# Patient Record
Sex: Female | Born: 1990 | Race: White | Hispanic: No | Marital: Married | Smoking: Never smoker
Health system: Southern US, Community
[De-identification: ages and names within clinical notes are randomized; demographics above are authoritative.]

## PROBLEM LIST (undated history)

## (undated) DIAGNOSIS — Z789 Other specified health status: Secondary | ICD-10-CM

## (undated) HISTORY — PX: NO PAST SURGERIES: SHX2092

## (undated) HISTORY — PX: HEMORRHOID SURGERY: SHX153

---

## 2018-08-27 HISTORY — PX: WISDOM TOOTH EXTRACTION: SHX21

## 2018-08-27 NOTE — L&D Delivery Note (Signed)
Pt was admitted in labor. She progressed along a nl labor curve. She had SROM. She pushed for 5 min and had a SVD of one live viable female infant in the ROA position over an intact perineum. Nuchal cord x 1. Placenta-S/I. EBL-400cc Baby to NBN

## 2018-09-21 ENCOUNTER — Emergency Department (HOSPITAL_COMMUNITY)
Admission: EM | Admit: 2018-09-21 | Discharge: 2018-09-21 | Disposition: A | Discharge status: Home / Self Care | Payer: Medicaid Other | Attending: Emergency Medicine | Admitting: Emergency Medicine

## 2018-09-21 ENCOUNTER — Encounter (HOSPITAL_COMMUNITY): Payer: Self-pay | Admitting: *Deleted

## 2018-09-21 DIAGNOSIS — Z5321 Procedure and treatment not carried out due to patient leaving prior to being seen by health care provider: Secondary | ICD-10-CM | POA: Insufficient documentation

## 2018-09-21 DIAGNOSIS — K0889 Other specified disorders of teeth and supporting structures: Secondary | ICD-10-CM | POA: Diagnosis not present

## 2018-09-21 NOTE — ED Triage Notes (Signed)
-  Via [place] Interpreter, pt has had dental pain x 2 week, procedure done for the same and has a 78.65 month old baby at home . She confirms she is taking tylenol 250/asa 250mg , tylenol 500mg , and ibuprofen 400mg  every 6 hours. Husband brought bottles and confirmed these are the doses and time frame she has been taking the medication, he is concerned this amount of medication is too much medication. She is having a headache and buzzing in her right ear.

## 2018-09-21 NOTE — ED Notes (Signed)
No answer in waiting room 

## 2018-09-22 ENCOUNTER — Emergency Department (HOSPITAL_COMMUNITY)
Admission: EM | Admit: 2018-09-22 | Discharge: 2018-09-23 | Disposition: A | Discharge status: Home / Self Care | Payer: Medicaid Other | Attending: Emergency Medicine | Admitting: Emergency Medicine

## 2018-09-22 ENCOUNTER — Encounter (HOSPITAL_COMMUNITY): Payer: Self-pay

## 2018-09-22 DIAGNOSIS — G43909 Migraine, unspecified, not intractable, without status migrainosus: Secondary | ICD-10-CM | POA: Diagnosis not present

## 2018-09-22 DIAGNOSIS — O9089 Other complications of the puerperium, not elsewhere classified: Secondary | ICD-10-CM

## 2018-09-22 DIAGNOSIS — L650 Telogen effluvium: Secondary | ICD-10-CM

## 2018-09-22 DIAGNOSIS — R51 Headache: Secondary | ICD-10-CM | POA: Diagnosis present

## 2018-09-22 DIAGNOSIS — L659 Nonscarring hair loss, unspecified: Secondary | ICD-10-CM | POA: Insufficient documentation

## 2018-09-22 NOTE — ED Triage Notes (Signed)
Pt having continued headaches after getting wisdom teeth pulled last week. Pt LWBS yesterday after waiting several hours. Pt taking tylenol and ibuprofen without relief. P also having dizziness but no blurred vision.

## 2018-09-23 ENCOUNTER — Emergency Department (HOSPITAL_COMMUNITY): Payer: Medicaid Other

## 2018-09-23 MED ORDER — METOCLOPRAMIDE HCL 5 MG/ML IJ SOLN
10.0000 mg | INTRAMUSCULAR | Status: AC
  Administered 2018-09-23: 10 mg via INTRAVENOUS
  Filled 2018-09-23: qty 2

## 2018-09-23 MED ORDER — KETOROLAC TROMETHAMINE 30 MG/ML IJ SOLN: 30.0000 mg | Freq: Once | INTRAMUSCULAR | Status: DC

## 2018-09-23 MED ORDER — BUTALBITAL-APAP-CAFFEINE 50-325-40 MG PO TABS: 1.0000 | ORAL_TABLET | Freq: Three times a day (TID) | ORAL | 0 refills | Status: DC | PRN

## 2018-09-23 MED ORDER — DIPHENHYDRAMINE HCL 50 MG/ML IJ SOLN
12.5000 mg | Freq: Once | INTRAMUSCULAR | Status: AC
  Administered 2018-09-23: 12.5 mg via INTRAVENOUS
  Filled 2018-09-23: qty 1

## 2018-09-23 NOTE — ED Provider Notes (Signed)
[facility] Provider Note   CSN: [id number] Arrival date & time: 09/22/18  1641     History   Chief Complaint Chief Complaint  Patient presents with  . Headache    HPI Jill Conley is a 28 y.o. female.  28 year old female with no significant past medical history presents to the emergency department for evaluation of a headache.  She has had a constant right sided parietal headache with intermittent sharp, lightninglike sensations to the top of her head x1.5 weeks.  She had her right lower wisdom tooth the left upper wisdom tooth extracted shortly prior to onset of her headache.  She was discharged with Percocet which she used for pain control x2 days.  Has otherwise been using Tylenol and ibuprofen without relief.  Husband states that the patient's body will "jerk" when she experiences the sharp pain sensation to the top of her head.  She has been getting poor sleep as a result of her ongoing discomfort.  Symptoms further associated with dizziness and photophobia.  She has not had any vomiting, extremity numbness or paresthesias, extremity weakness, fevers, persistent facial swelling, dentalgia.  She also notes recent hair loss, but she is 3.5 months postpartum.  No hx of prior migraines.  The history is provided by the patient and the spouse. No language interpreter was used.  Headache    History reviewed. No pertinent past medical history.  There are no active problems to display for this patient.   Past Surgical History:  Procedure Laterality Date  . WISDOM TOOTH EXTRACTION  2020     OB History   No obstetric history on file.      Home Medications    Prior to Admission medications   Medication Sig Start Date End Date Taking? Authorizing Provider  butalbital-acetaminophen-caffeine (FIORICET, ESGIC) [phone] MG tablet Take 1-2 tablets by mouth every 8 (eight) hours as needed for headache. 09/23/18 09/23/19  Antony MaduraHumes, Becky Berberian, PA-C     Family History No family history on file.  Social History Social History   Tobacco Use  . Smoking status: Never Smoker  Substance Use Topics  . Alcohol use: Never    Frequency: Never  . Drug use: Never     Allergies   Patient has no known allergies.   Review of Systems Review of Systems  Neurological: Positive for headaches.  Ten systems reviewed and are negative for acute change, except as noted in the HPI.    Physical Exam Updated Vital Signs BP 121/80   Pulse 69   Temp 98.7 F (37.1 C) (Oral)   Resp 14   SpO2 100%   Physical Exam Vitals signs and nursing note reviewed.  Constitutional:      General: She is not in acute distress.    Appearance: She is well-developed. She is not diaphoretic.     Comments: Nontoxic appearing and in NAD  HENT:     Head: Normocephalic and atraumatic.     Comments: No facial swelling    Right Ear: Ear canal and external ear normal.     Left Ear: Ear canal and external ear normal.     Ears:     Comments: Cerumen obscuring much of right TM; visualized portion is normal.    Mouth/Throat:     Mouth: Mucous membranes are moist.     Comments: No malocclusion, trismus, gingival fluctuance or purulence. Site of prior wisdom tooth extraction appears to be healing well. Symmetric rise of the uvula with phonation.  Eyes:     General: No scleral icterus.    Conjunctiva/sclera: Conjunctivae normal.     Pupils: Pupils are equal, round, and reactive to light.     Comments: Normal EOMs.  Neck:     Musculoskeletal: Normal range of motion.     Comments: No meningismus Pulmonary:     Effort: Pulmonary effort is normal. No respiratory distress.     Comments: Respirations even and unlabored Musculoskeletal: Normal range of motion.  Skin:    General: Skin is warm and dry.     Coloration: Skin is not pale.     Findings: No erythema or rash.  Neurological:     General: No focal deficit present.     Mental Status: She is alert and  oriented to person, place, and time.     Coordination: Coordination normal.     Comments: GCS 15. Speech is goal oriented. No cranial nerve deficits appreciated; symmetric eyebrow raise, no facial drooping, tongue midline. Patient has equal grip strength bilaterally with 5/5 strength against resistance in all major muscle groups bilaterally. Sensation to light touch intact. Patient moves extremities without ataxia.   Psychiatric:        Behavior: Behavior normal.      ED Treatments / Results  Labs (all labs ordered are listed, but only abnormal results are displayed) Labs Reviewed - No data to display  EKG None  Radiology Ct Head Wo Contrast  Result Date: 09/23/2018 CLINICAL DATA:  Headache EXAM: CT HEAD WITHOUT CONTRAST TECHNIQUE: Contiguous axial images were obtained from the base of the skull through the vertex without intravenous contrast. COMPARISON:  None. FINDINGS: Brain: There is no mass, hemorrhage or extra-axial collection. The size and configuration of the ventricles and extra-axial CSF spaces are normal. The brain parenchyma is normal, without acute or chronic infarction. Vascular: No abnormal hyperdensity of the major intracranial arteries or dural venous sinuses. No intracranial atherosclerosis. Skull: The visualized skull base, calvarium and extracranial soft tissues are normal. Sinuses/Orbits: No fluid levels or advanced mucosal thickening of the visualized paranasal sinuses. No mastoid or middle ear effusion. The orbits are normal. IMPRESSION: Normal head CT. Electronically Signed   By: Deatra RobinsonKevin  Herman M.D.   On: 09/23/2018 04:46    Procedures Procedures (including critical care time)  Medications Ordered in ED Medications  diphenhydrAMINE (BENADRYL) injection 12.5 mg (12.5 mg Intravenous Given 09/23/18 0446)  metoCLOPramide (REGLAN) injection 10 mg (10 mg Intravenous Given 09/23/18 0446)    5:33 AM Patient reassessed.  Noted to be sleeping.  Upon waking, patient states  that her headache has subsided.  Will cancel additional Toradol.  Plan to discharge with Fioricet.  The patient is not breast-feeding.   Initial Impression / Assessment and Plan / ED Course  I have reviewed the triage vital signs and the nursing notes.  Pertinent labs & imaging results that were available during my care of the patient were reviewed by me and considered in my medical decision making (see chart for details).     Patient presents to the emergency department for evaluation of headache which began 1.5 weeks ago.  Patient with no history of recent head injury or trauma.  Did have a wisdom tooth extraction prior to onset of pain.  No fever, nuchal rigidity, meningismus to suggest meningitis.  Neurologic exam today is nonfocal.  Her head CT today is reassuring without signs of mass, hemorrhage, hydrocephalus.  On reassessment, the patient has had significant improvement in headache symptoms following a migraine cocktail.  I do not believe further emergent workup is indicated at this time.  Return precautions discussed and provided.  Patient discharged in stable condition with no unaddressed concerns.   Vitals:   09/22/18 1716 09/22/18 2123 09/23/18 0220 09/23/18 0545  BP: 118/67 119/74 121/80 105/66  Pulse: (!) 58 74 69 72  Resp: 14 14 14 16   Temp: 98.7 F (37.1 C)     TempSrc: Oral     SpO2: 98% 100% 100% 97%    Final Clinical Impressions(s) / ED Diagnoses   Final diagnoses:  Migraine without status migrainosus, not intractable, unspecified migraine type  Postpartum alopecia    ED Discharge Orders         Ordered    butalbital-acetaminophen-caffeine (FIORICET, ESGIC) 50-325-40 MG tablet  Every 8 hours PRN     09/23/18 0535           Antony Madura, PA-C 09/23/18 0602    Ward, Layla Maw, DO 09/23/18 [phone]

## 2018-09-23 NOTE — Discharge Instructions (Signed)
We recommend use of Fioricet as needed for persistent headache.  Follow-up with a primary care doctor to ensure that symptoms resolve.  If you experience ongoing headaches, you may benefit from follow-up with neurology.  Call to make an appointment with a neurologist if necessary.

## 2019-03-27 DIAGNOSIS — Z23 Encounter for immunization: Secondary | ICD-10-CM | POA: Diagnosis not present

## 2019-03-27 DIAGNOSIS — Z3480 Encounter for supervision of other normal pregnancy, unspecified trimester: Secondary | ICD-10-CM | POA: Diagnosis not present

## 2019-04-24 DIAGNOSIS — O26843 Uterine size-date discrepancy, third trimester: Secondary | ICD-10-CM | POA: Diagnosis not present

## 2019-05-15 ENCOUNTER — Inpatient Hospital Stay (HOSPITAL_COMMUNITY): Payer: Medicaid Other | Admitting: Anesthesiology

## 2019-05-15 ENCOUNTER — Inpatient Hospital Stay (HOSPITAL_COMMUNITY)
Admission: EM | Admit: 2019-05-15 | Discharge: 2019-05-17 | DRG: 807 | Disposition: A | Discharge status: Home / Self Care | Payer: Medicaid Other | Attending: Obstetrics and Gynecology | Admitting: Obstetrics and Gynecology

## 2019-05-15 ENCOUNTER — Other Ambulatory Visit: Payer: Self-pay

## 2019-05-15 ENCOUNTER — Encounter (HOSPITAL_COMMUNITY): Payer: Self-pay | Admitting: *Deleted

## 2019-05-15 DIAGNOSIS — Z3490 Encounter for supervision of normal pregnancy, unspecified, unspecified trimester: Secondary | ICD-10-CM

## 2019-05-15 DIAGNOSIS — Z20828 Contact with and (suspected) exposure to other viral communicable diseases: Secondary | ICD-10-CM | POA: Diagnosis not present

## 2019-05-15 DIAGNOSIS — Z3A35 35 weeks gestation of pregnancy: Secondary | ICD-10-CM | POA: Diagnosis not present

## 2019-05-15 DIAGNOSIS — O99824 Streptococcus B carrier state complicating childbirth: Secondary | ICD-10-CM | POA: Diagnosis present

## 2019-05-15 DIAGNOSIS — O479 False labor, unspecified: Secondary | ICD-10-CM | POA: Diagnosis present

## 2019-05-15 DIAGNOSIS — O4700 False labor before 37 completed weeks of gestation, unspecified trimester: Secondary | ICD-10-CM | POA: Diagnosis present

## 2019-05-15 HISTORY — DX: Other specified health status: Z78.9

## 2019-05-15 LAB — SARS CORONAVIRUS 2 BY RT PCR (HOSPITAL ORDER, PERFORMED IN ~~LOC~~ HOSPITAL LAB): SARS Coronavirus 2: NEGATIVE

## 2019-05-15 LAB — CBC
HCT: 38.5 % (ref 36.0–46.0)
Hemoglobin: 13.1 g/dL (ref 12.0–15.0)
MCH: 31.5 pg (ref 26.0–34.0)
MCHC: 34 g/dL (ref 30.0–36.0)
MCV: 92.5 fL (ref 80.0–100.0)
Platelets: 278 10*3/uL (ref 150–400)
RBC: 4.16 MIL/uL (ref 3.87–5.11)
RDW: 12.5 % (ref 11.5–15.5)
WBC: 13.1 10*3/uL — ABNORMAL HIGH (ref 4.0–10.5)
nRBC: 0 % (ref 0.0–0.2)

## 2019-05-15 LAB — URINALYSIS, ROUTINE W REFLEX MICROSCOPIC
Bilirubin Urine: NEGATIVE
Glucose, UA: NEGATIVE mg/dL
Hgb urine dipstick: NEGATIVE
Ketones, ur: NEGATIVE mg/dL
Nitrite: NEGATIVE
Protein, ur: NEGATIVE mg/dL
Specific Gravity, Urine: 1.008 (ref 1.005–1.030)
pH: 6 (ref 5.0–8.0)

## 2019-05-15 LAB — TYPE AND SCREEN
ABO/RH(D): A POS
Antibody Screen: NEGATIVE

## 2019-05-15 LAB — GROUP B STREP BY PCR: Group B strep by PCR: POSITIVE — AB

## 2019-05-15 MED ORDER — DIBUCAINE (PERIANAL) 1 % EX OINT: 1.0000 "application " | TOPICAL_OINTMENT | CUTANEOUS | Status: DC | PRN

## 2019-05-15 MED ORDER — ACETAMINOPHEN 325 MG PO TABS
650.0000 mg | ORAL_TABLET | ORAL | Status: DC | PRN
  Administered 2019-05-15 – 2019-05-17 (×5): 650 mg via ORAL
  Filled 2019-05-15 (×5): qty 2

## 2019-05-15 MED ORDER — SENNOSIDES-DOCUSATE SODIUM 8.6-50 MG PO TABS
2.0000 | ORAL_TABLET | ORAL | Status: DC
  Administered 2019-05-16 – 2019-05-17 (×2): 2 via ORAL
  Filled 2019-05-15 (×2): qty 2

## 2019-05-15 MED ORDER — ONDANSETRON HCL 4 MG PO TABS: 4.0000 mg | ORAL_TABLET | ORAL | Status: DC | PRN

## 2019-05-15 MED ORDER — MEASLES, MUMPS & RUBELLA VAC IJ SOLR: 0.5000 mL | Freq: Once | INTRAMUSCULAR | Status: DC

## 2019-05-15 MED ORDER — BETAMETHASONE SOD PHOS & ACET 6 (3-3) MG/ML IJ SUSP
12.0000 mg | Freq: Once | INTRAMUSCULAR | Status: DC
  Filled 2019-05-15: qty 2

## 2019-05-15 MED ORDER — PHENYLEPHRINE 40 MCG/ML (10ML) SYRINGE FOR IV PUSH (FOR BLOOD PRESSURE SUPPORT)
80.0000 ug | PREFILLED_SYRINGE | INTRAVENOUS | Status: AC | PRN
  Administered 2019-05-15 (×3): 80 ug via INTRAVENOUS

## 2019-05-15 MED ORDER — ACETAMINOPHEN 325 MG PO TABS: 650.0000 mg | ORAL_TABLET | ORAL | Status: DC | PRN

## 2019-05-15 MED ORDER — ONDANSETRON HCL 4 MG/2ML IJ SOLN: 4.0000 mg | INTRAMUSCULAR | Status: DC | PRN

## 2019-05-15 MED ORDER — LIDOCAINE HCL (PF) 1 % IJ SOLN: 30.0000 mL | INTRAMUSCULAR | Status: DC | PRN

## 2019-05-15 MED ORDER — DIPHENHYDRAMINE HCL 50 MG/ML IJ SOLN: 12.5000 mg | INTRAMUSCULAR | Status: DC | PRN

## 2019-05-15 MED ORDER — TETANUS-DIPHTH-ACELL PERTUSSIS 5-2.5-18.5 LF-MCG/0.5 IM SUSP: 0.5000 mL | Freq: Once | INTRAMUSCULAR | Status: DC

## 2019-05-15 MED ORDER — OXYTOCIN 40 UNITS IN NORMAL SALINE INFUSION - SIMPLE MED
2.5000 [IU]/h | INTRAVENOUS | Status: DC
  Filled 2019-05-15: qty 1000

## 2019-05-15 MED ORDER — LIDOCAINE HCL (PF) 1 % IJ SOLN
INTRAMUSCULAR | Status: DC | PRN
  Administered 2019-05-15 (×2): 4 mL via EPIDURAL

## 2019-05-15 MED ORDER — PHENYLEPHRINE 40 MCG/ML (10ML) SYRINGE FOR IV PUSH (FOR BLOOD PRESSURE SUPPORT)
80.0000 ug | PREFILLED_SYRINGE | INTRAVENOUS | Status: DC | PRN
  Filled 2019-05-15: qty 10

## 2019-05-15 MED ORDER — LACTATED RINGERS IV SOLN: 500.0000 mL | INTRAVENOUS | Status: DC | PRN

## 2019-05-15 MED ORDER — IBUPROFEN 600 MG PO TABS
600.0000 mg | ORAL_TABLET | Freq: Four times a day (QID) | ORAL | Status: DC
  Administered 2019-05-15 – 2019-05-17 (×9): 600 mg via ORAL
  Filled 2019-05-15 (×9): qty 1

## 2019-05-15 MED ORDER — ZOLPIDEM TARTRATE 5 MG PO TABS: 5.0000 mg | ORAL_TABLET | Freq: Every evening | ORAL | Status: DC | PRN

## 2019-05-15 MED ORDER — SIMETHICONE 80 MG PO CHEW: 80.0000 mg | CHEWABLE_TABLET | ORAL | Status: DC | PRN

## 2019-05-15 MED ORDER — LACTATED RINGERS IV SOLN: 500.0000 mL | Freq: Once | INTRAVENOUS | Status: DC

## 2019-05-15 MED ORDER — LACTATED RINGERS IV SOLN: INTRAVENOUS | Status: DC

## 2019-05-15 MED ORDER — SODIUM CHLORIDE (PF) 0.9 % IJ SOLN
INTRAMUSCULAR | Status: DC | PRN
  Administered 2019-05-15: 12 mL/h via EPIDURAL

## 2019-05-15 MED ORDER — ONDANSETRON HCL 4 MG/2ML IJ SOLN: 4.0000 mg | Freq: Four times a day (QID) | INTRAMUSCULAR | Status: DC | PRN

## 2019-05-15 MED ORDER — BUTORPHANOL TARTRATE 1 MG/ML IJ SOLN: 1.0000 mg | INTRAMUSCULAR | Status: DC | PRN

## 2019-05-15 MED ORDER — BENZOCAINE-MENTHOL 20-0.5 % EX AERO
1.0000 "application " | INHALATION_SPRAY | CUTANEOUS | Status: DC | PRN
  Filled 2019-05-15: qty 56

## 2019-05-15 MED ORDER — FENTANYL-BUPIVACAINE-NACL 0.5-0.125-0.9 MG/250ML-% EP SOLN
12.0000 mL/h | EPIDURAL | Status: DC | PRN
  Filled 2019-05-15: qty 250

## 2019-05-15 MED ORDER — SODIUM CHLORIDE 0.9 % IV SOLN
1.0000 g | Freq: Four times a day (QID) | INTRAVENOUS | Status: DC
  Filled 2019-05-15 (×3): qty 1000

## 2019-05-15 MED ORDER — OXYCODONE-ACETAMINOPHEN 5-325 MG PO TABS: 2.0000 | ORAL_TABLET | ORAL | Status: DC | PRN

## 2019-05-15 MED ORDER — SOD CITRATE-CITRIC ACID 500-334 MG/5ML PO SOLN: 30.0000 mL | ORAL | Status: DC | PRN

## 2019-05-15 MED ORDER — WITCH HAZEL-GLYCERIN EX PADS
1.0000 "application " | MEDICATED_PAD | CUTANEOUS | Status: DC | PRN
  Administered 2019-05-17: 1 via TOPICAL

## 2019-05-15 MED ORDER — EPHEDRINE 5 MG/ML INJ: 10.0000 mg | INTRAVENOUS | Status: DC | PRN

## 2019-05-15 MED ORDER — SODIUM CHLORIDE 0.9 % IV SOLN
2.0000 g | Freq: Once | INTRAVENOUS | Status: AC
  Administered 2019-05-15: 2 g via INTRAVENOUS
  Filled 2019-05-15: qty 2000

## 2019-05-15 MED ORDER — OXYCODONE-ACETAMINOPHEN 5-325 MG PO TABS: 1.0000 | ORAL_TABLET | ORAL | Status: DC | PRN

## 2019-05-15 MED ORDER — OXYTOCIN BOLUS FROM INFUSION
500.0000 mL | Freq: Once | INTRAVENOUS | Status: AC
  Administered 2019-05-15: 09:00:00 500 mL via INTRAVENOUS

## 2019-05-15 MED ORDER — COCONUT OIL OIL: 1.0000 "application " | TOPICAL_OIL | Status: DC | PRN

## 2019-05-15 NOTE — Anesthesia Postprocedure Evaluation (Signed)
Anesthesia Post Note  Patient: Conservator, museum/gallery  Procedure(s) Performed: AN AD [employer]     Patient location during evaluation: Mother Baby Anesthesia Type: Epidural Level of consciousness: awake and alert, oriented and patient cooperative Pain management: pain level controlled Vital Signs Assessment: post-procedure vital signs reviewed and stable Respiratory status: spontaneous breathing Cardiovascular status: stable Postop Assessment: no headache, epidural receding, patient able to bend at knees, no signs of nausea or vomiting and able to ambulate Anesthetic complications: no Comments: Pt. Interviewed via Astronomer.  Pt states she is walking.  Pain score 3.     Last Vitals:  Vitals:   05/15/19 1020 05/15/19 1120  BP: (!) 103/58 103/60  Pulse: 70 (!) 58  Resp: 18 17  Temp: 36.6 C 36.8 C  SpO2: 100%     Last Pain:  Vitals:   05/15/19 1120  TempSrc: Oral  PainSc: 6    Pain Goal:                   [facility]

## 2019-05-15 NOTE — H&P (Signed)
Jill Conley is an 28 y.o. G53P1001 [redacted]w[redacted]d female who presents to the ER c/o ctxs. Pt was then admitted in labor. PNC was uncomplicated. Here initial Urine culture was positive for GBS. Nl NIPT, Nl OGTT  Past Medical History:  Diagnosis Date  . Medical history non-contributory     Past Surgical History:  Procedure Laterality Date  . NO PAST SURGERIES    . [place] EXTRACTION  2020    History reviewed. No pertinent family history. Social History:  reports that she has never smoked. She does not have any smokeless tobacco history on file. She reports that she does not drink alcohol or use drugs.  Allergies: No Known Allergies  Medications Prior to Admission  Medication Sig Dispense Refill  . butalbital-acetaminophen-caffeine (FIORICET, ESGIC) 50-325-40 MG tablet Take 1-2 tablets by mouth every 8 (eight) hours as needed for headache. 15 tablet 0  . Docusate Sodium (COLACE PO) Take by mouth.    . Prenatal Vit-Fe Fumarate-FA (MULTIVITAMIN-PRENATAL) 27-0.8 MG TABS tablet Take 1 tablet by mouth daily at 12 noon.         Blood pressure (!) 105/59, pulse 65, temperature 97.6 F (36.4 C), temperature source Oral, resp. rate 18, height 5\' 3"  (1.6 m), weight 73.9 kg, SpO2 100 %. General appearance: alert and cooperative Abdomen: gravid , non tender   Lab Results  Component Value Date   WBC 13.1 (H) 05/15/2019   HGB 13.1 05/15/2019   HCT 38.5 05/15/2019   MCV 92.5 05/15/2019   PLT 278 05/15/2019   No results found for: PREGTESTUR, PREGSERUM, HCG, HCGQUANT    Patient Active Problem List   Diagnosis Date Noted  . Preterm uterine contractions 05/15/2019   IMP/ IUP at 36weeks in labor         Positive GBS Plan/ Admit            Ampicillin ANDERSON,MARK E 05/15/2019, 7:57 AM

## 2019-05-15 NOTE — MAU Note (Signed)
Pt reports to MAU c/o ctx every 2-5 min. No bleeding or LOF. +FM.  

## 2019-05-15 NOTE — MAU Provider Note (Signed)
Chief Complaint:  Contractions   First Provider Initiated Contact with Patient 05/15/19 [phone]      HPI: Jill Conley is a 28 y.o. G2P1001 at 71w5dwho presents to maternity admissions reporting painful uterine contractions since about 3am.  .She reports good fetal movement, denies LOF, vaginal bleeding, vaginal itching/burning, urinary symptoms, h/a, dizziness, n/v, diarrhea, constipation or fever/chills.  .   Past Medical History: No past medical history on file.  Past obstetric history: OB History  Gravida Para Term Preterm AB Living  2 1 1     1   SAB TAB Ectopic Multiple Live Births          1    # Outcome Date GA Lbr Len/2nd Weight Sex Delivery Anes PTL Lv  2 Current           1 Term             Past Surgical History: Past Surgical History:  Procedure Laterality Date  . WISDOM TOOTH EXTRACTION  2020    Family History: No family history on file.  Social History: Social History   Tobacco Use  . Smoking status: Never Smoker  Substance Use Topics  . Alcohol use: Never    Frequency: Never  . Drug use: Never    Allergies: No Known Allergies  Meds:  Medications Prior to Admission  Medication Sig Dispense Refill Last Dose  . butalbital-acetaminophen-caffeine (FIORICET, ESGIC) 50-325-40 MG tablet Take 1-2 tablets by mouth every 8 (eight) hours as needed for headache. 15 tablet 0 05/14/2019 at Unknown time  . Docusate Sodium (COLACE PO) Take by mouth.   05/14/2019 at Unknown time  . Prenatal Vit-Fe Fumarate-FA (MULTIVITAMIN-PRENATAL) 27-0.8 MG TABS tablet Take 1 tablet by mouth daily at 12 noon.   Past Month at Unknown time    I have reviewed patient's Past Medical Hx, Surgical Hx, Family Hx, Social Hx, medications and allergies.   ROS:  Review of Systems  Constitutional: Negative for chills and fever.  Respiratory: Negative for shortness of breath.   Gastrointestinal: Positive for abdominal pain. Negative for constipation, diarrhea and nausea.  Genitourinary:  Positive for pelvic pain. Negative for vaginal bleeding.   Other systems negative  Physical Exam   Constitutional: Well-developed, well-nourished female in no acute distress, but uncomfortable.  Cardiovascular: normal rate and rhythm Respiratory: normal effort, clear to auscultation bilaterally GI: Abd soft, non-tender, gravid appropriate for gestational age.   No rebound or guarding. MS: Extremities nontender, no edema, normal ROM Neurologic: Alert and oriented x 4.  GU: Neg CVAT.  PELVIC EXAM:  Dilation: 3.5 Effacement (%): 70 Station: [place], -3 Presentation: Vertex Exam by:: Hansel Feinstein CNM    FHT:  Baseline 135 , moderate variability, accelerations present, no decelerations Contractions: q 2-3 mins Irregular     Labs: No results found for this or any previous visit (from the past 24 hour(s)).     Imaging:  No results found.  MAU Course/MDM: NST reviewed and is reactive Consult Dr Roselie Awkward and Dr Philis Pique with presentation, exam findings and test results.  Treatments in MAU included IV fluids, prepare for admission.    Assessment: Single intrauterine pregnancy at [redacted]w[redacted]d Preterm uterine contractions Active labor  Plan: Admit to Labor and Delivery  Betamethasone IV fluids Epidural Ampicillin for GBS prophylaxis (GBS sent) MD to follow  Hansel Feinstein CNM, MSN Certified Nurse-Midwife 05/15/2019 5:24 AM

## 2019-05-15 NOTE — Lactation Note (Addendum)
This note was copied from a baby's chart. Lactation Consultation Note Baby 62 hrs old. Baby hasn't been interested in feeding. Has latched a few times. Has been spitting up. Spit up 2 times curled formula while LC in rm. FOB is interpreter for mom.  Mom has 40 month old. Stated she tried to BF that child but her milk never came in. She had colostrum for 1 week then nothing. Discussed pumping every 3 hrs for stimulation for Lactation induction. Mom doesn't have a pump at home. Mom has [place], [facility] sending referral for pump from [facility].  LPI information sheet given to FOB. They had one all ready. Discussed importance of supplementing after BF, and how long and often to feed. STS, importance of strict I&O, breast massage, LPI behavior and feeding habits, supply and demand discussed. FOB stated he understood. LC taught hand expression. Mom assisted collecting 2 ml colostrum. Milk storage educated. Discussed spoon feeding small amount to stimulate baby to want to feed. Ranger set up DEBP for mom mom sat up and pumping. Mom didn't pump 5 min. Moaning in pain from abd. Cramps. LC explained normal.  Mom shown how to use DEBP & how to disassemble, clean, & reassemble parts. Mom knows to pump q3h for 15-20 min.   Encouraged mom to call for assistance or questions. Lactation brochure given. [facility] referral faxed.  Patient Name: Jill Conley CLEXN'T Date: 05/15/2019 Reason for consult: Initial assessment;Late-preterm 34-36.6wks;Infant < 6lbs   Maternal Data Has patient been taught Hand Expression?: Yes Does the patient have breastfeeding experience prior to this delivery?: Yes  Feeding    LATCH Score Latch: Too sleepy or reluctant, no latch achieved, no sucking elicited.  Audible Swallowing: None  Type of Nipple: Flat  Comfort (Breast/Nipple): Soft / non-tender  Hold (Positioning): Full assist, staff holds infant at breast  LATCH Score: 3  Interventions Interventions: Breast feeding basics  reviewed;DEBP;Support pillows;Skin to skin;Position options;Breast massage;Expressed milk;Hand express;Breast compression;Shells;Pre-pump if needed  Lactation Tools Discussed/Used Tools: Pump;Shells Shell Type: Inverted Breast pump type: Double-Electric Breast Pump WIC Program: Yes Pump Review: Setup, frequency, and cleaning;Milk Storage Initiated by:: Allayne Stack RN IBCLC Date initiated:: 05/15/19   Consult Status Consult Status: Follow-up Date: 05/16/19 Follow-up type: In-patient    Theodoro Kalata 05/15/2019, 9:47 PM

## 2019-05-15 NOTE — Anesthesia Preprocedure Evaluation (Signed)
Anesthesia Evaluation  Patient identified by MRN, date of birth, ID band Patient awake    Reviewed: Allergy & Precautions, Patient's Chart, lab work & pertinent test results  History of Anesthesia Complications Negative for: history of anesthetic complications  Airway Mallampati: II  TM Distance: >3 FB Neck ROM: Full    Dental no notable dental hx.    Pulmonary neg pulmonary ROS,    Pulmonary exam normal        Cardiovascular negative cardio ROS Normal cardiovascular exam     Neuro/Psych negative neurological ROS  negative psych ROS   GI/Hepatic negative GI ROS, Neg liver ROS,   Endo/Other  negative endocrine ROS  Renal/GU negative Renal ROS     Musculoskeletal negative musculoskeletal ROS (+)   Abdominal   Peds  Hematology negative hematology ROS (+)   Anesthesia Other Findings   Reproductive/Obstetrics (+) Pregnancy                             Anesthesia Physical Anesthesia Plan  ASA: II  Anesthesia Plan: Epidural   Post-op Pain Management:    Induction:   PONV Risk Score and Plan: Treatment may vary due to age or medical condition  Airway Management Planned: Natural Airway  Additional Equipment:   Intra-op Plan:   Post-operative Plan:   Informed Consent: I have reviewed the patients History and Physical, chart, labs and discussed the procedure including the risks, benefits and alternatives for the proposed anesthesia with the patient or authorized representative who has indicated his/her understanding and acceptance.       Plan Discussed with: CRNA  Anesthesia Plan Comments: (Consent obtained with patient's husband interpreting (has signed [facility] papers for interpreting for patient).)        Anesthesia Quick Evaluation

## 2019-05-15 NOTE — H&P (Signed)
Jill Conley is a 28 y.o. female presenting for uterine contractions since 3am History of recent delivery a year ago . OB History    Gravida  2   Para  1   Term  1   Preterm      AB      Living  1     SAB      TAB      Ectopic      Multiple      Live Births  1          No past medical history on file. Past Surgical History:  Procedure Laterality Date  . WISDOM TOOTH EXTRACTION  2020   Family History: family history is not on file. Social History:  reports that she has never smoked. She does not have any smokeless tobacco history on file. She reports that she does not drink alcohol or use drugs.     Maternal Diabetes: No Genetic Screening: Normal Maternal Ultrasounds/Referrals: Normal Fetal Ultrasounds or other Referrals:  None Maternal Substance Abuse:  No Significant Maternal Medications:  None Significant Maternal Lab Results:  None Other Comments:  Preterm labor  Review of Systems  Constitutional: Negative for chills and fever.  Respiratory: Negative for cough and shortness of breath.   Gastrointestinal: Positive for abdominal pain. Negative for constipation, diarrhea, nausea and vomiting.   Maternal Medical History:  Reason for admission: Contractions.  Nausea.  Contractions: Onset was 1-2 hours ago.   Frequency: irregular.   Perceived severity is strong.    Fetal activity: Perceived fetal activity is normal.   Last perceived fetal movement was within the past hour.    Prenatal complications: Preterm labor.   No bleeding, PIH or pre-eclampsia.   Prenatal Complications - Diabetes: none.    Dilation: 5 Effacement (%): 70 Station: -2 Exam by:: Lauren Cox RN  There were no vitals taken for this visit. Maternal Exam:  Uterine Assessment: Contraction strength is firm.  Contraction frequency is irregular.   Abdomen: Patient reports no abdominal tenderness. Fetal presentation: vertex  Introitus: Normal vulva. Normal vagina.  Ferning test:  not done.  Nitrazine test: not done.  Pelvis: adequate for delivery.   Cervix: Cervix evaluated by digital exam.     Fetal Exam Fetal Monitor Review: Mode: ultrasound.   Baseline rate: 135.  Variability: moderate (6-25 bpm).   Pattern: accelerations present and no decelerations.    Fetal State Assessment: Category I - tracings are normal.     Physical Exam  Constitutional: She is oriented to person, place, and time. She appears well-developed and well-nourished.  HENT:  Head: Normocephalic.  Cardiovascular: Normal rate and regular rhythm.  Respiratory: Effort normal. No respiratory distress.  GI: Soft. She exhibits no distension. There is no abdominal tenderness. There is no rebound and no guarding.  Genitourinary:    Vulva normal.     Genitourinary Comments: Dilation: 5 Effacement (%): 70 Station: -2 Presentation: Vertex Exam by:: Patricia Pesa RN     Musculoskeletal: Normal range of motion.  Neurological: She is alert and oriented to person, place, and time.  Skin: Skin is warm and dry.  Psychiatric: She has a normal mood and affect.    Prenatal labs: ABO, Rh:   Antibody:   Rubella:   RPR:    HBsAg:    HIV:    GBS:     Assessment/Plan: Single intrauterine pregnancy at [redacted]w[redacted]d Preterm labor  Admit to Labor and Delivery Routine orders Betamethasone Ampicillin for presumptive GBS prophylaxis  MD to follow   Wynelle BourgeoisMarie Tamrah Victorino 05/15/2019, 5:56 AM

## 2019-05-15 NOTE — Anesthesia Procedure Notes (Signed)
Epidural Patient location during procedure: OB Start time: 05/15/2019 6:32 AM End time: 05/15/2019 6:34 AM  Staffing Anesthesiologist: Brennan Bailey, MD Performed: anesthesiologist   Preanesthetic Checklist Completed: patient identified, pre-op evaluation, timeout performed, IV checked, risks and benefits discussed and monitors and equipment checked  Epidural Patient position: sitting Prep: site prepped and draped and DuraPrep Patient monitoring: continuous pulse ox, blood pressure and heart rate Approach: midline Location: L3-L4 Injection technique: LOR air  Needle:  Needle type: Tuohy  Needle gauge: 17 G Needle length: 9 cm Catheter type: closed end flexible Catheter size: 19 Gauge Catheter at skin depth: 9 cm Test dose: negative and Other (1% lidocaine)  Assessment Events: blood not aspirated, injection not painful, no injection resistance, negative IV test and no paresthesia  Additional Notes Patient identified. Risks, benefits, and alternatives discussed with patient including but not limited to bleeding, infection, nerve damage, paralysis, failed block, incomplete pain control, headache, blood pressure changes, nausea, vomiting, reactions to medication, itching, and postpartum back pain. Confirmed with bedside nurse the patient's most recent platelet count. Confirmed with patient that they are not currently taking any anticoagulation, have any bleeding history, or any family history of bleeding disorders. Patient expressed understanding and wished to proceed. All questions were answered. Sterile technique was used throughout the entire procedure. Please see nursing notes for vital signs.   Crisp LOR on first pass. Test dose was given through epidural catheter and negative prior to continuing to dose epidural or start infusion. Warning signs of high block given to the patient including shortness of breath, tingling/numbness in hands, complete motor block, or any concerning  symptoms with instructions to call for help. Patient was given instructions on fall risk and not to get out of bed. All questions and concerns addressed with instructions to call with any issues or inadequate analgesia.  Reason for block:procedure for pain

## 2019-05-16 LAB — ABO/RH: ABO/RH(D): A POS

## 2019-05-16 LAB — CBC
HCT: 33.5 % — ABNORMAL LOW (ref 36.0–46.0)
Hemoglobin: 11.3 g/dL — ABNORMAL LOW (ref 12.0–15.0)
MCH: 31.8 pg (ref 26.0–34.0)
MCHC: 33.7 g/dL (ref 30.0–36.0)
MCV: 94.4 fL (ref 80.0–100.0)
Platelets: 264 10*3/uL (ref 150–400)
RBC: 3.55 MIL/uL — ABNORMAL LOW (ref 3.87–5.11)
RDW: 12.5 % (ref 11.5–15.5)
WBC: 11.3 10*3/uL — ABNORMAL HIGH (ref 4.0–10.5)
nRBC: 0 % (ref 0.0–0.2)

## 2019-05-16 LAB — RPR: RPR Ser Ql: NONREACTIVE

## 2019-05-16 MED ORDER — IBUPROFEN 600 MG PO TABS: 600.0000 mg | ORAL_TABLET | Freq: Four times a day (QID) | ORAL | 0 refills | Status: DC

## 2019-05-16 NOTE — Progress Notes (Signed)
PPD#1 Pt without complaints. Lochia mild VSSAF IMP/Doing well Plan/Will discharge to home.

## 2019-05-16 NOTE — Progress Notes (Signed)
Dr Ouida Sills notified of baby not going home till tomorrow so discharge is cancelled for pt

## 2019-05-16 NOTE — Progress Notes (Signed)
Patient and father were made aware they could not co-sleep. During hourly rounds baby was found on the sofa with dad. A gentle reminder was given about safe sleep. Patient and father verbalize understanding.

## 2019-05-16 NOTE — Discharge Summary (Signed)
Obstetric Discharge Summary Reason for Admission: onset of labor Prenatal Procedures: ultrasound Intrapartum Procedures: spontaneous vaginal delivery Postpartum Procedures: none Complications-Operative and Postpartum: none Hemoglobin  Date Value Ref Range Status  05/16/2019 11.3 (L) 12.0 - 15.0 g/dL Final   HCT  Date Value Ref Range Status  05/16/2019 33.5 (L) 36.0 - 46.0 % Final    Physical Exam:  General: alert and cooperative Lochia: appropriate Uterine Fundus: firm   Discharge Diagnoses: Premature labor and delivery  Discharge Information: Date: 05/16/2019 Activity: pelvic rest Diet: routine Medications: PNV, Ibuprofen and Colace Condition: stable Instructions: refer to practice specific booklet Discharge to: home Follow-up Information    Jill Millers, MD. Schedule an appointment as soon as possible for a visit in 1 month(s).   Specialty: Obstetrics and Gynecology Contact information: [place] [phone]           Newborn Data: Live born female  Birth Weight: 5 lb 6.8 oz (2461 g) APGAR: 9, 9  Newborn Delivery   Birth date/time: 05/15/2019 08:28:00 Delivery type: Vaginal, Spontaneous      Home with mother.  Jill Conley E 05/16/2019, 9:57 AM

## 2019-05-17 NOTE — Lactation Note (Signed)
This note was copied from a baby's chart. Lactation Consultation Note:   LC was paged to check infants latch . I arrived in the room and observed mother with infant in cross cradle hold. Father reports that infant has been feeding for 5 mins.   Observed a few intermittent sucks and swallows.  Infant on and off and only suckling with breast compression. No observed swallows.   SNS was sat up with 20 ml . Infant suckled on and off for 15 mins. He transferred 4 ml.  Father gave infant 5 ml of ebm and the fed infant with formula.   Tisa staff nurse from NICU came to room with a DR Owens Shark bottle.  She reports that she phoned Daisa from speech, and that she was going to come and see infant.  Feeding assessment report given to Lauren NP. Parents are eager to go home per father.  Advised mother to pump now with hand pump as father has already packed the electric pump parts.  Discussed importance of pumping to protect mothers milk supply.  Mother was fit with a #24 NS, she reports that she used the shield with her last child.  Mother to follow up with [facility] services for feeding assessemt.  Patient Name: Boy Navy Rothschild [id number] Date: 05/17/2019 Reason for consult: Follow-up assessment   Maternal Data    Feeding Feeding Type: Formula  LATCH Score Latch: Repeated attempts needed to sustain latch, nipple held in mouth throughout feeding, stimulation needed to elicit sucking reflex.  Audible Swallowing: A few with stimulation  Type of Nipple: Everted at rest and after stimulation  Comfort (Breast/Nipple): Filling, red/small blisters or bruises, mild/mod discomfort  Hold (Positioning): Assistance needed to correctly position infant at breast and maintain latch.  LATCH Score: 6  Interventions Interventions: Assisted with latch;Skin to skin;Breast massage;Hand express;Breast compression;Adjust position;Support pillows;Position options;Expressed milk;Comfort gels;Hand pump;DEBP  Lactation  Tools Discussed/Used     Consult Status Consult Status: Follow-up Date: 05/18/19 Follow-up type: In-patient    Jess Barters [facility] 05/17/2019, 4:18 PM

## 2019-05-17 NOTE — Lactation Note (Signed)
This note was copied from a baby's chart. Lactation Consultation Note:  Mother attempting to breastfeed infant when I arrived in the room.  Assist mother with better pillow support. Infant cuing but  not very interested in feeding. Several attempts to latch infant. I had mother to undress infant to try and rouse him. Only a few sucks was observed.  Mother is hand expressing and has pumped 5 ml . Infant was given 5 ml with a curved tip syringe while finger feeding.   Father reports that infant took the bottle with 20 ml. 30 mins ago. Father interprets for mother. Mother reports that left nipple is slightly sore. Nipples are slightly pink no noted cracks.  Mother was given comfort gels.   Mother was offered a [facility] loaner pump. A WIC referral was sent yesterday.  Father reports that they will use the harmony hand pump and he will go to [facility] in am to get the electric pump.   Advised mother in treatment and prevention of engorgement.   Mother to page for assist from staff nurse or [facility] with the next feeding attempt.     Patient Name: Jill Conley [id number] Date: 05/17/2019 Reason for consult: Follow-up assessment   Maternal Data    Feeding Feeding Type: Formula  LATCH Score Latch: Repeated attempts needed to sustain latch, nipple held in mouth throughout feeding, stimulation needed to elicit sucking reflex.  Audible Swallowing: A few with stimulation  Type of Nipple: Everted at rest and after stimulation  Comfort (Breast/Nipple): Filling, red/small blisters or bruises, mild/mod discomfort  Hold (Positioning): Assistance needed to correctly position infant at breast and maintain latch.  LATCH Score: 6  Interventions Interventions: Assisted with latch;Skin to skin;Breast massage;Hand express;Breast compression;Adjust position;Support pillows;Position options;Expressed milk;Comfort gels;Hand pump;DEBP  Lactation Tools Discussed/Used     Consult Status Consult Status:  Follow-up Date: 05/18/19 Follow-up type: In-patient    Jess Barters [facility] 05/17/2019, 3:53 PM

## 2019-05-17 NOTE — Progress Notes (Signed)
PPD#2 Pt doing well. Unsure if pt can be discharged PLAN/Will discharge pt

## 2019-05-22 ENCOUNTER — Emergency Department (HOSPITAL_COMMUNITY)
Admission: EM | Admit: 2019-05-22 | Discharge: 2019-05-22 | Disposition: A | Discharge status: Home / Self Care | Payer: Medicaid Other | Attending: Emergency Medicine | Admitting: Emergency Medicine

## 2019-05-22 DIAGNOSIS — H9201 Otalgia, right ear: Secondary | ICD-10-CM | POA: Insufficient documentation

## 2019-05-22 DIAGNOSIS — Z79899 Other long term (current) drug therapy: Secondary | ICD-10-CM | POA: Diagnosis not present

## 2019-05-22 MED ORDER — OFLOXACIN 0.3 % OT SOLN: 5.0000 [drp] | Freq: Two times a day (BID) | OTIC | 0 refills | Status: DC

## 2019-05-22 NOTE — ED Triage Notes (Addendum)
Patient reports Right ear pain X 1 month Reports that she has not been able to hear well for a week. She believes that her ear-phones caused her to "go deaf"    She has used 3 medications without any relief.  *Electronic interpreter used for this exchange*

## 2019-05-22 NOTE — Discharge Instructions (Signed)
Please follow up with ENT Dr. Wilburn Cornelia as well as Audiology services given you are having some difficulty hearing out of your ear  Please use the ear drops as prescribed  You may take Tylenol as needed for your pain

## 2019-05-22 NOTE — ED Provider Notes (Signed)
[facility] EMERGENCY DEPARTMENT Provider Note   CSN: [id number] Arrival date & time: 05/22/19  1124     History   Chief Complaint Chief Complaint  Patient presents with  . Otalgia    right    HPI Jill Conley is a 28 y.o. female resents to the ED today complaining of gradual onset, intermittent, right ear pain and difficulty hearing out of it for the past 3 to 4 years, worsening in the last week.  She reports that she has tried over-the-counter eardrops which typically work for her but they have not worked this past week prompting her to come to the ED today.  She has been taking Tylenol with mild relief.  She reports that she has never seen a medical provider for this in the past.  Denies fever, chills, ear drainage, trauma to the ear, sore throat, rhinorrhea, postnasal drip, vision changes, headache, any other associated symptoms.      The history is provided by the patient. The history is limited by a language barrier. A language interpreter was used.    Past Medical History:  Diagnosis Date  . Medical history non-contributory     Patient Active Problem List   Diagnosis Date Noted  . Preterm uterine contractions 05/15/2019  . Pregnancy 05/15/2019    Past Surgical History:  Procedure Laterality Date  . NO PAST SURGERIES    . WISDOM TOOTH EXTRACTION  2020     OB History    Gravida  2   Para  2   Term  1   Preterm  1   AB      Living  2     SAB      TAB      Ectopic      Multiple  0   Live Births  2            Home Medications    Prior to Admission medications   Medication Sig Start Date End Date Taking? Authorizing Provider  docusate sodium (COLACE) 100 MG capsule Take 200 mg by mouth daily.     [provider]  hydrocortisone 2.5 % ointment Apply 1 application topically 2 (two) times daily as needed (hemorrhoids).  04/09/19   [provider]  ibuprofen (ADVIL) 600 MG tablet Take 1 tablet (600 mg total)  by mouth every 6 (six) hours. 05/16/19   Levi Aland, MD  ofloxacin (FLOXIN) 0.3 % OTIC solution Place 5 drops into the right ear 2 (two) times daily. 05/22/19   Hyman Hopes, Kharter Brew, PA-C  polyethylene glycol powder (GLYCOLAX/MIRALAX) 17 GM/SCOOP powder Take 17 g by mouth every other day. Mix in 8 oz liquid and drink 01/20/19   [provider]  Prenatal Vit-Fe Fumarate-FA (MULTIVITAMIN-PRENATAL) 27-0.8 MG TABS tablet Take 1 tablet by mouth daily.     [provider]  witch hazel-glycerin (TUCKS) pad Apply 1 application topically 4 (four) times daily as needed for hemorrhoids.     [provider]    Family History No family history on file.  Social History Social History   Tobacco Use  . Smoking status: Never Smoker  Substance Use Topics  . Alcohol use: Never    Frequency: Never  . Drug use: Never     Allergies   Patient has no known allergies.   Review of Systems Review of Systems  Constitutional: Negative for chills and fever.  HENT: Positive for ear pain. Negative for congestion, ear discharge, facial swelling, rhinorrhea, sore  throat, trouble swallowing and voice change.   Eyes: Negative for visual disturbance.     Physical Exam Updated Vital Signs BP 112/71 (BP Location: Left Arm)   Pulse 68   Temp 98.3 F (36.8 C) (Oral)   Resp 15   SpO2 97%   Physical Exam Vitals signs and nursing note reviewed.  Constitutional:      Appearance: She is not ill-appearing.  HENT:     Head: Normocephalic and atraumatic.     Right Ear: Tympanic membrane normal.     Left Ear: Tympanic membrane normal.     Ears:     Comments: Small amount of cerumen to right ear canal but not obstructing TM in any manner.  No erythema or edema to TM.  External auditory canal clear.  Patient does have some tenderness to palpation to tragus. Tenderness with pulling of ear. No mastoid tenderness.     Mouth/Throat:     Mouth: Mucous membranes are moist.     Pharynx: No  oropharyngeal exudate or posterior oropharyngeal erythema.  Eyes:     Conjunctiva/sclera: Conjunctivae normal.  Cardiovascular:     Rate and Rhythm: Normal rate and regular rhythm.  Pulmonary:     Effort: Pulmonary effort is normal.     Breath sounds: Normal breath sounds.  Skin:    General: Skin is warm and dry.     Coloration: Skin is not jaundiced.  Neurological:     Mental Status: She is alert.      ED Treatments / Results  Labs (all labs ordered are listed, but only abnormal results are displayed) Labs Reviewed - No data to display  EKG None  Radiology No results found.  Procedures Procedures (including critical care time)  Medications Ordered in ED Medications - No data to display   Initial Impression / Assessment and Plan / ED Course  I have reviewed the triage vital signs and the nursing notes.  Pertinent labs & imaging results that were available during my care of the patient were reviewed by me and considered in my medical decision making (see chart for details).    28 year old female who presents the ED with complaints of right ear pain intermittently for the past 3 to 4 years worsening over the last week.  She denies any injury to the ear.  She has been using over-the-counter eardrops without relief.  On exam there is mild cerumen but not impacting TM in any way.  No infection noted to the TM or external auditory canal.  Patient does have some tenderness to the tragus with palpation.  He does complain of hearing loss but is able to hear me appropriately with whisper test.  Given she has some tenderness to tragus will prescribe ofloxacin drops today.  Advised patient that she will need to follow-up with ENT and audiology outpatient given chronicity of symptoms.  She is advised to take Tylenol for pain.  Patient is in agreement with plan at this time stable for discharge home.  Strict return precautions have been discussed.   This note was prepared using Dragon  voice recognition software and may include unintentional dictation errors due to the inherent limitations of voice recognition software.       Final Clinical Impressions(s) / ED Diagnoses   Final diagnoses:  Otalgia of right ear    ED Discharge Orders         Ordered    ofloxacin (FLOXIN) 0.3 % OTIC solution  2 times daily  05/22/19 1329           Tanda RockersVenter, Tanea Moga, PA-C 05/22/19 1334    Lorre NickAllen, Anthony, MD 05/25/19 [phone]

## 2019-05-22 NOTE — ED Notes (Signed)
Patient verbalizes understanding of discharge instructions. Opportunity for questioning and answers were provided. Armband removed by staff, pt discharged from ED.  

## 2019-05-25 ENCOUNTER — Telehealth (HOSPITAL_COMMUNITY): Payer: Self-pay | Admitting: Lactation Services

## 2019-05-25 NOTE — Telephone Encounter (Signed)
Pediatrician from [place] [facility] called and requested lactation call the parents at home.  Family has only one more nipple to use to feed baby.  The baby was born at 56 weeks and is currently 4 days old.  I called father and he informed me that they have plenly of bottles but cannot find appropriate nipples for their baby.  He has called around and no one has any nipples that will work for baby.  I told him that if he would like to come to the hospital tomorrow morning we can provide some nipples for him to use.  Father appreciative and will arrive in the a.m.  Malka So called and situation discussed.  Joycelyn Schmid will provide nipples for this family when father arrives.

## 2019-05-27 DIAGNOSIS — H9201 Otalgia, right ear: Secondary | ICD-10-CM | POA: Diagnosis not present

## 2019-05-27 DIAGNOSIS — M2669 Other specified disorders of temporomandibular joint: Secondary | ICD-10-CM | POA: Diagnosis not present

## 2019-06-08 ENCOUNTER — Other Ambulatory Visit: Payer: Self-pay

## 2019-06-08 ENCOUNTER — Emergency Department (HOSPITAL_COMMUNITY)
Admission: EM | Admit: 2019-06-08 | Discharge: 2019-06-09 | Disposition: A | Discharge status: Home / Self Care | Payer: Medicaid Other | Attending: Emergency Medicine | Admitting: Emergency Medicine

## 2019-06-08 DIAGNOSIS — K648 Other hemorrhoids: Secondary | ICD-10-CM

## 2019-06-08 DIAGNOSIS — O872 Hemorrhoids in the puerperium: Secondary | ICD-10-CM | POA: Diagnosis not present

## 2019-06-08 DIAGNOSIS — Z79899 Other long term (current) drug therapy: Secondary | ICD-10-CM | POA: Diagnosis not present

## 2019-06-08 NOTE — ED Triage Notes (Signed)
Pt here for evaluation of hemmorhoid, with hx of same but she sts it has come out more than normal. Endorses some bleeding from site this morning. Pt had a baby two weeks ago.

## 2019-06-09 MED ORDER — DIBUCAINE (PERIANAL) 1 % EX OINT: TOPICAL_OINTMENT | CUTANEOUS | 1 refills | Status: DC

## 2019-06-09 NOTE — ED Notes (Signed)
Reviewed discharge paperwork w/ pt and spouse (over the phone).  No questions, verbalized understanding of discharge/follow up instructions and prescriptions.

## 2019-06-09 NOTE — Discharge Instructions (Addendum)
Hemorrhoids  The mainstay of treatment and prevention of hemorrhoids is taking steps to assure regular, soft bowel movements.  Hydration: It is recommended that you drink at least eight 8 ounce glasses of water a day to stay well-hydrated.  Fiber: May use fiber supplements, such as methylcellulose (eg, Citrucel) or psyllium (eg, Metamucil).  You may also increase the amount of fiber in your diet.  Symptomatic treatments  Hydrocortisone: Hydrocortisone creams or suppositories may be used to reduce inflammation and provide pain relief.  Use these treatments for no more than 7 days at a time.  Dibucaine ointment: This medication may be applied to a painful, external hemorrhoid, as needed.  Do not insert into the rectum, do not get this medication into the eyes or mouth.  Use extra caution to keep this medication away from children.  Wash hands immediately after use. This medication may be used in place of other topical pain relief medications, such as lidocaine.  Which Hazel: Apply as needed up to 6 times per day or after each bowel movement.  This can be found as a liquid or in products such as Tucks or Preparation H pads.  Stool softeners: May use stool softeners, such as docusate (generic for Colace), to improve comfort with bowel movements.  Follow-up: Due to the persistence and the severity of the hemorrhoid, surgical evaluation is recommended.  Call to make an appointment.

## 2019-06-09 NOTE — ED Notes (Addendum)
On touch pt, assisted provider w/ exam.  Assessment completed w/ the assistance of the interpretor.  Also spoke to her husband over the phone.

## 2019-06-09 NOTE — ED Provider Notes (Signed)
[facility] EMERGENCY DEPARTMENT Provider Note   CSN: [id number] Arrival date & time: 06/08/19  1741     History   Chief Complaint No chief complaint on file.   HPI Jill Conley is a 28 y.o. female.     A language interpreter was used [place]).     Jill Conley is a 28 y.o. female, with a history of hemorrhoids, presenting to the ED with hemorrhoid she has had for about the last 2 years since her first pregnancy.  Symptoms improved in between pregnancies, but worsened again during her most recent pregnancy.  She delivered this latest pregnancy about 3 weeks ago, but the hemorrhoid has not improved. She states she has had some issues with constipation, but has intermittently used Colace and MiraLAX. For the hemorrhoids, she has tried Tucks, lidocaine cream, sitz baths, and hydrocortisone 2.5%. She states the hemorrhoid is sometimes inside and sometimes outside.  Denies fever, abdominal pain, persistent bleeding, or any other complaints.      Past Medical History:  Diagnosis Date  . Medical history non-contributory     Patient Active Problem List   Diagnosis Date Noted  . Preterm uterine contractions 05/15/2019  . Pregnancy 05/15/2019    Past Surgical History:  Procedure Laterality Date  . NO PAST SURGERIES    . WISDOM TOOTH EXTRACTION  2020     OB History    Gravida  2   Para  2   Term  1   Preterm  1   AB      Living  2     SAB      TAB      Ectopic      Multiple  0   Live Births  2            Home Medications    Prior to Admission medications   Medication Sig Start Date End Date Taking? Authorizing Provider  dibucaine (NUPERCAINAL) 1 % OINT Apply a small, pea-sized amount to painful external hemorrhoid, 3-4 times daily, as needed. 06/09/19   Noelia Lenart C, PA-C  docusate sodium (COLACE) 100 MG capsule Take 200 mg by mouth daily.     [provider]  hydrocortisone 2.5 % ointment Apply 1 application  topically 2 (two) times daily as needed (hemorrhoids).  04/09/19   [provider]  ibuprofen (ADVIL) 600 MG tablet Take 1 tablet (600 mg total) by mouth every 6 (six) hours. 05/16/19   Levi Aland, MD  ofloxacin (FLOXIN) 0.3 % OTIC solution Place 5 drops into the right ear 2 (two) times daily. 05/22/19   Hyman Hopes, Margaux, PA-C  polyethylene glycol powder (GLYCOLAX/MIRALAX) 17 GM/SCOOP powder Take 17 g by mouth every other day. Mix in 8 oz liquid and drink 01/20/19   [provider]  Prenatal Vit-Fe Fumarate-FA (MULTIVITAMIN-PRENATAL) 27-0.8 MG TABS tablet Take 1 tablet by mouth daily.     [provider]  witch hazel-glycerin (TUCKS) pad Apply 1 application topically 4 (four) times daily as needed for hemorrhoids.     [provider]    Family History No family history on file.  Social History Social History   Tobacco Use  . Smoking status: Never Smoker  Substance Use Topics  . Alcohol use: Never    Frequency: Never  . Drug use: Never     Allergies   Patient has no known allergies.   Review of Systems Review of Systems  Constitutional: Negative for fever.  Gastrointestinal: Positive for  constipation. Negative for abdominal pain and diarrhea.  Genitourinary:       Hemorrhoid     Physical Exam Updated Vital Signs BP 114/79 (BP Location: Left Arm)   Pulse 69   Temp 98.6 F (37 C) (Oral)   Resp 18   SpO2 100%   Physical Exam Vitals signs and nursing note reviewed.  Constitutional:      General: She is not in acute distress.    Appearance: She is well-developed. She is not diaphoretic.  HENT:     Head: Normocephalic and atraumatic.  Eyes:     Conjunctiva/sclera: Conjunctivae normal.  Neck:     Musculoskeletal: Neck supple.  Cardiovascular:     Rate and Rhythm: Normal rate and regular rhythm.  Pulmonary:     Effort: Pulmonary effort is normal.  Abdominal:     Palpations: Abdomen is soft.     Tenderness: There is no abdominal  tenderness.  Genitourinary:    Comments: Large, soft hemorrhoid that may actually be a prolapsed internal hemorrhoid.  Tender to the touch, but with normal-appearing coloring and capillary refill.  Does not feel firm, immobile, or abnormally colored.  Does not appear to be thrombosed. No frank blood noted. RN, Anderson Malta, served as chaperone during exam. Skin:    General: Skin is warm and dry.     Coloration: Skin is not pale.  Neurological:     Mental Status: She is alert.  Psychiatric:        Behavior: Behavior normal.      ED Treatments / Results  Labs (all labs ordered are listed, but only abnormal results are displayed) Labs Reviewed - No data to display  EKG None  Radiology No results found.  Procedures Procedures (including critical care time)  Medications Ordered in ED Medications - No data to display   Initial Impression / Assessment and Plan / ED Course  I have reviewed the triage vital signs and the nursing notes.  Pertinent labs & imaging results that were available during my care of the patient were reviewed by me and considered in my medical decision making (see chart for details).        Patient presents with persistent hemorrhoid.  She has tried multiple different treatments without resolution.  Based on the size and persistence of the hemorrhoid, I think the next step for the patient should be surgical evaluation.  I prescribed an additional symptomatic therapy for her to try until she can be seen by the surgeon.  Final Clinical Impressions(s) / ED Diagnoses   Final diagnoses:  Internal hemorrhoid    ED Discharge Orders         Ordered    dibucaine (NUPERCAINAL) 1 % OINT     06/09/19 0132           Lorayne Bender, PA-C 30/16/01 0932    Delora Fuel, MD 35/57/32 [phone]

## 2019-06-22 DIAGNOSIS — K6289 Other specified diseases of anus and rectum: Secondary | ICD-10-CM | POA: Diagnosis not present

## 2019-08-06 DIAGNOSIS — Z3202 Encounter for pregnancy test, result negative: Secondary | ICD-10-CM | POA: Diagnosis not present

## 2019-08-06 DIAGNOSIS — Z30430 Encounter for insertion of intrauterine contraceptive device: Secondary | ICD-10-CM | POA: Diagnosis not present

## 2020-02-25 DIAGNOSIS — Z419 Encounter for procedure for purposes other than remedying health state, unspecified: Secondary | ICD-10-CM | POA: Diagnosis not present

## 2020-03-27 DIAGNOSIS — Z419 Encounter for procedure for purposes other than remedying health state, unspecified: Secondary | ICD-10-CM | POA: Diagnosis not present

## 2020-03-31 DIAGNOSIS — N925 Other specified irregular menstruation: Secondary | ICD-10-CM | POA: Diagnosis not present

## 2020-04-27 DIAGNOSIS — Z419 Encounter for procedure for purposes other than remedying health state, unspecified: Secondary | ICD-10-CM | POA: Diagnosis not present

## 2020-05-27 DIAGNOSIS — Z419 Encounter for procedure for purposes other than remedying health state, unspecified: Secondary | ICD-10-CM | POA: Diagnosis not present

## 2020-06-02 IMAGING — CT CT HEAD W/O CM
4 series · 16 of 47 positions shown, 18 images · non-contrast
Comparison: None.

CLINICAL DATA: Headache

EXAM:
CT HEAD WITHOUT CONTRAST
TECHNIQUE: Contiguous axial images were obtained from the base of the skull
through the vertex without intravenous contrast.

[Series 3: head wo · axial · 0.40mm/px · z∈[-142,-22]mm · 7 of 33 slices shown, 9 images]
[im 5/33  brain]
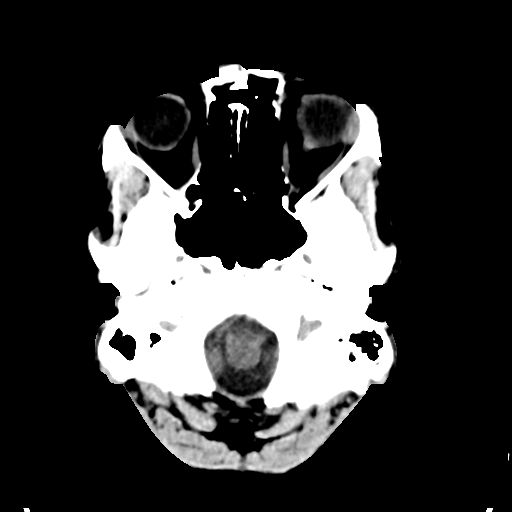
[im 5/33  bone]
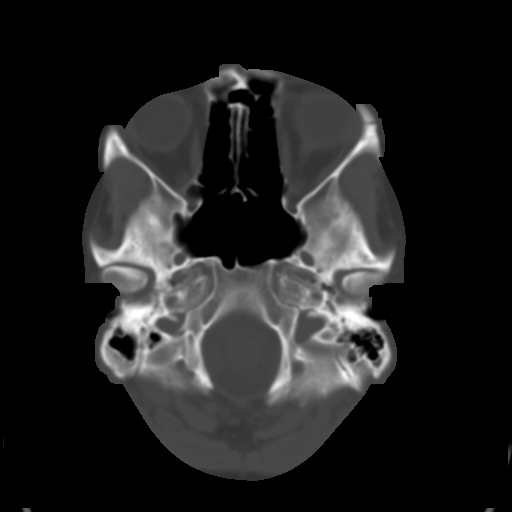
[im 9/33  brain]
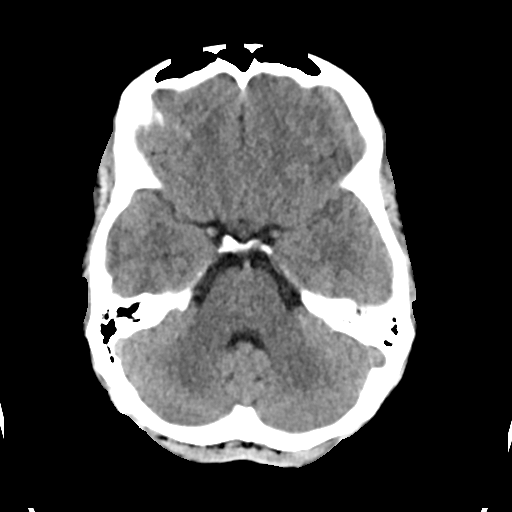
[im 13/33  brain]
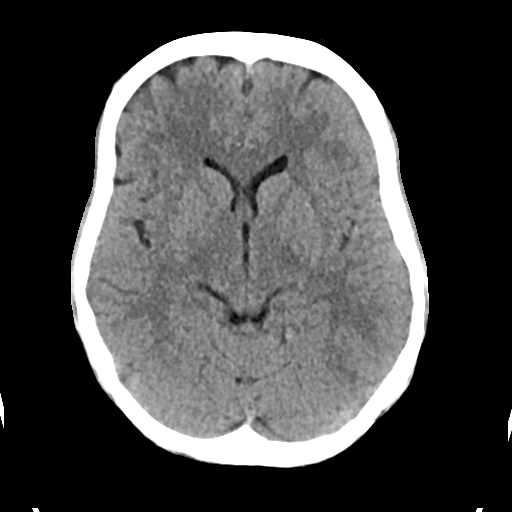
[im 17/33  brain]
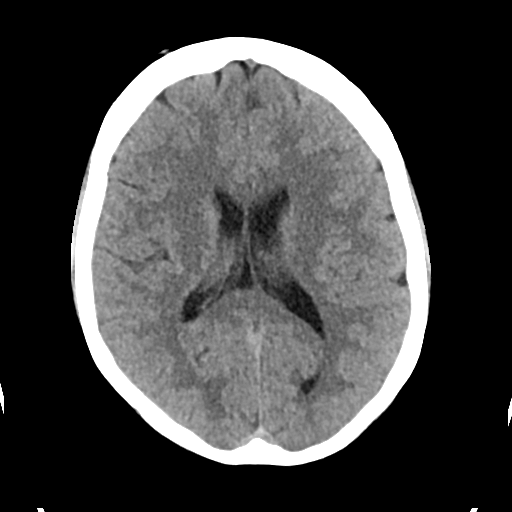
[im 21/33  brain]
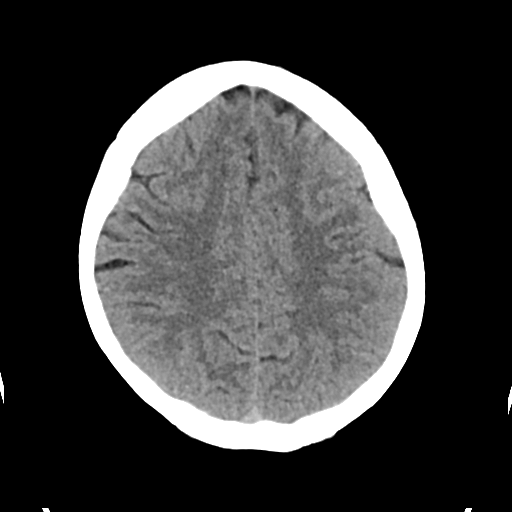
[im 21/33  bone]
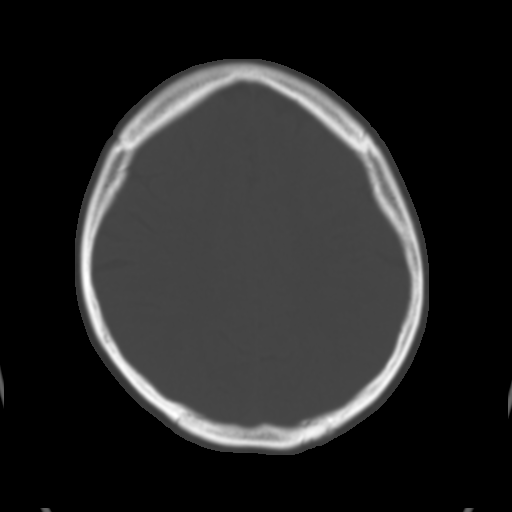
[im 25/33  brain]
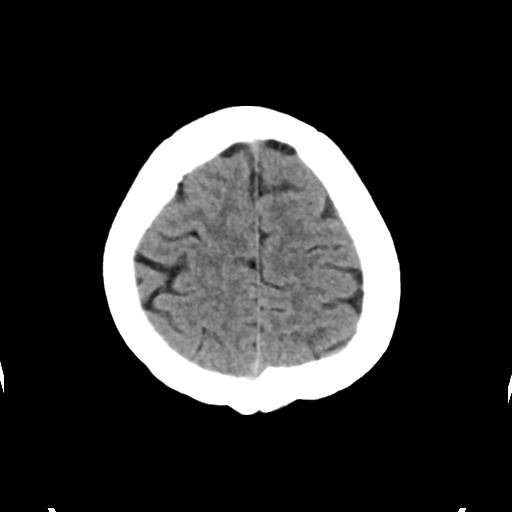
[im 29/33  brain]
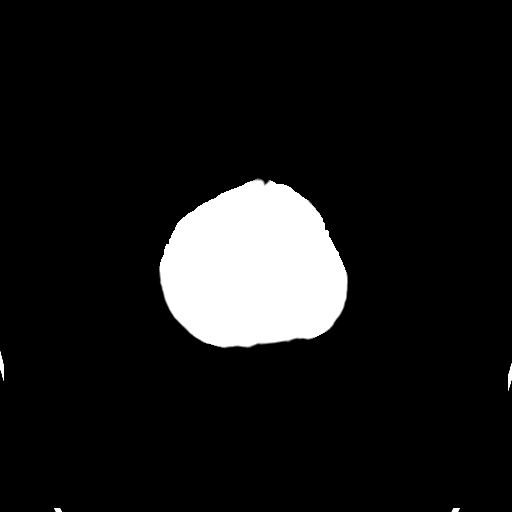

[Series 4: head bone · axial · 0.40mm/px · z∈[-146,-114]mm · 3 of 82 slices shown]
[im 9/82  bone]
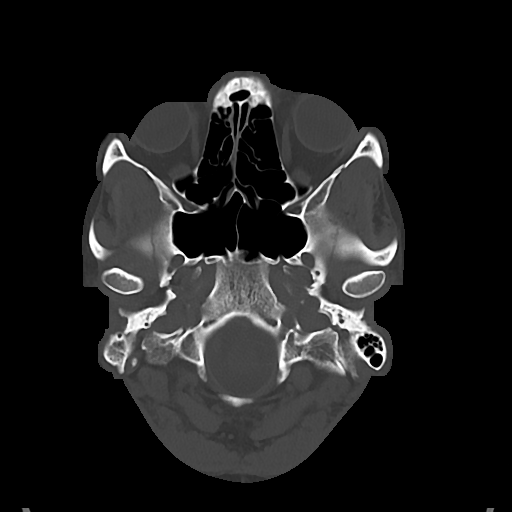
[im 17/82  bone]
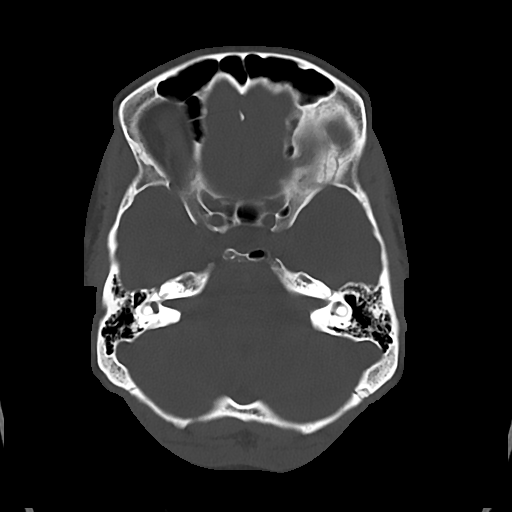
[im 25/82  bone]
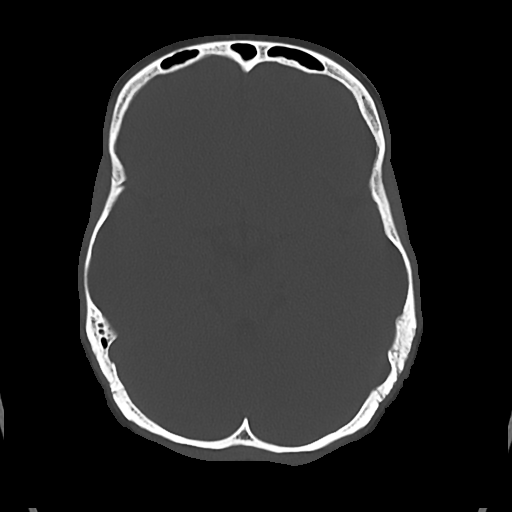

[Series 5: cor soft · coronal · 0.32mm/px · 3 of 67 slices shown]
[im 23/67  brain]
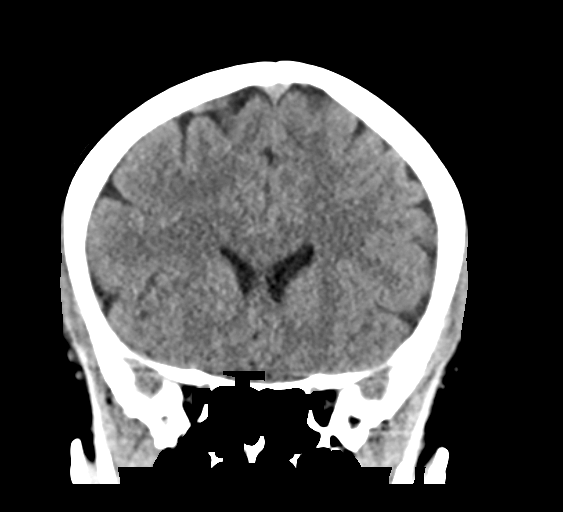
[im 30/67  brain]
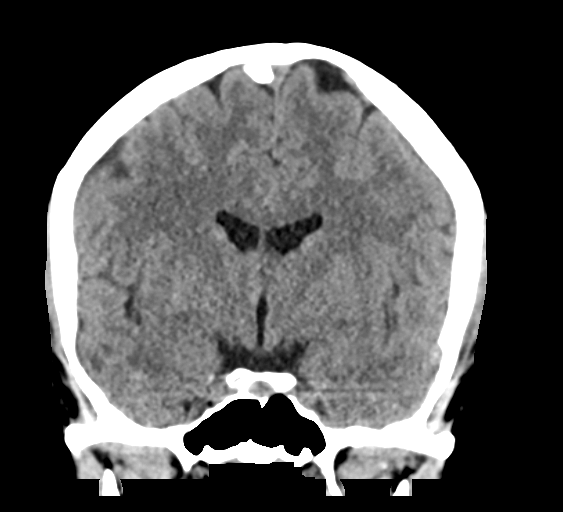
[im 37/67  brain]
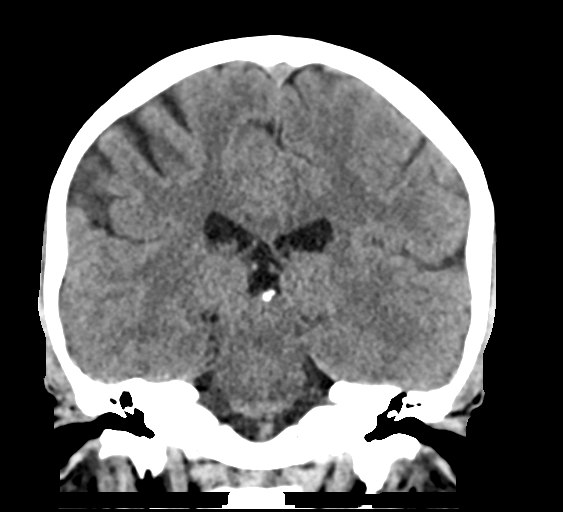

[Series 6: sag soft · sagittal · 0.32mm/px · 3 of 57 slices shown]
[im 19/57  brain]
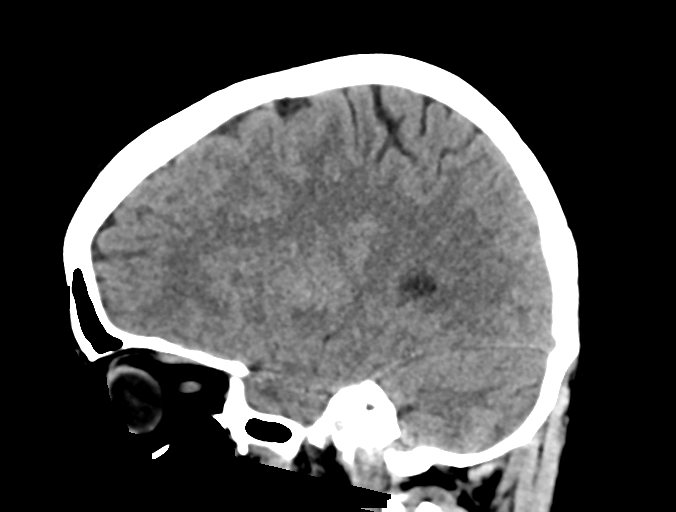
[im 29/57  brain]
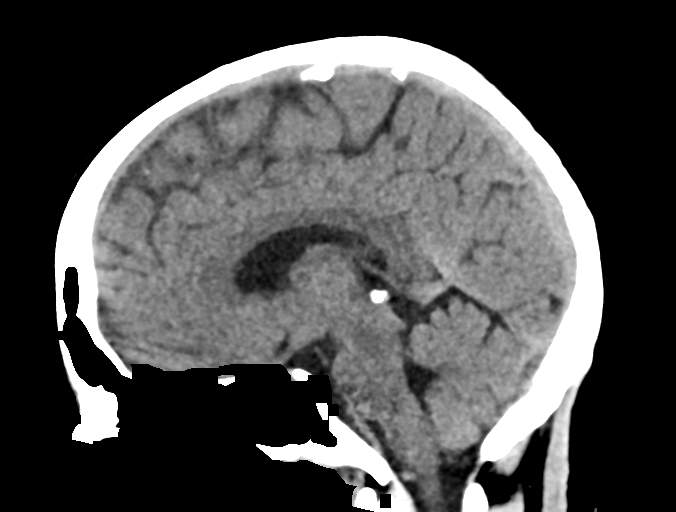
[im 38/57  brain]
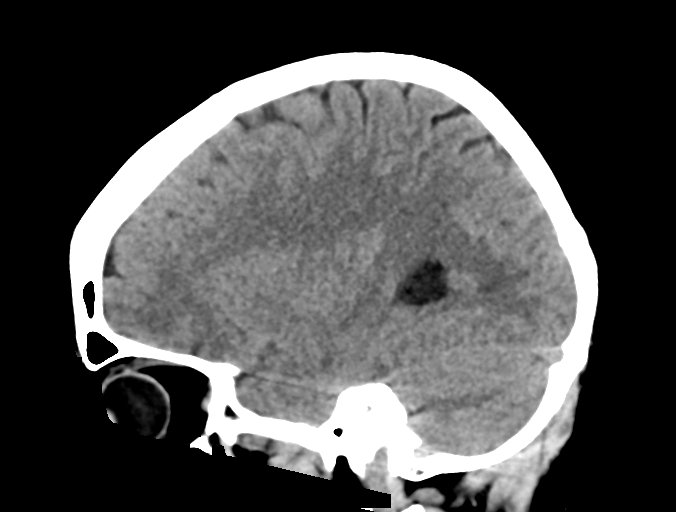

[16 of 47 positions shown; findings below may reference images not displayed]

FINDINGS: Brain: There is no mass, hemorrhage or extra-axial collection. The
size and configuration of the ventricles and extra-axial CSF spaces
are normal. The brain parenchyma is normal, without acute or chronic
infarction.

Vascular: No abnormal hyperdensity of the major intracranial
arteries or dural venous sinuses. No intracranial atherosclerosis.

Skull: The visualized skull base, calvarium and extracranial soft
tissues are normal.

Sinuses/Orbits: No fluid levels or advanced mucosal thickening of
the visualized paranasal sinuses. No mastoid or middle ear effusion.
The orbits are normal.
IMPRESSION: Normal head CT.

## 2020-06-27 DIAGNOSIS — Z419 Encounter for procedure for purposes other than remedying health state, unspecified: Secondary | ICD-10-CM | POA: Diagnosis not present

## 2020-07-27 DIAGNOSIS — Z419 Encounter for procedure for purposes other than remedying health state, unspecified: Secondary | ICD-10-CM | POA: Diagnosis not present

## 2020-08-27 DIAGNOSIS — Z419 Encounter for procedure for purposes other than remedying health state, unspecified: Secondary | ICD-10-CM | POA: Diagnosis not present

## 2020-08-27 NOTE — L&D Delivery Note (Signed)
Delivery Note She progressed to complete and pushed well.  At 4:18 PM a viable female was delivered via Vaginal, Spontaneous (Presentation: vtx, OP).  APGAR: 8, 9; weight pending.   Placenta status: Spontaneous, Intact.  Cord:   with the following complications:  none.  Anesthesia: Epidural Episiotomy: None  Lacerations: None  Suture Repair:  none Est. Blood Loss (mL): 100   Mom to postpartum.  Baby to Couplet care / Skin to Skin.  Jill Conley 08/18/2021, 4:30 PM

## 2020-09-27 DIAGNOSIS — Z419 Encounter for procedure for purposes other than remedying health state, unspecified: Secondary | ICD-10-CM | POA: Diagnosis not present

## 2020-10-25 DIAGNOSIS — Z419 Encounter for procedure for purposes other than remedying health state, unspecified: Secondary | ICD-10-CM | POA: Diagnosis not present

## 2020-11-25 DIAGNOSIS — Z419 Encounter for procedure for purposes other than remedying health state, unspecified: Secondary | ICD-10-CM | POA: Diagnosis not present

## 2020-12-25 DIAGNOSIS — Z419 Encounter for procedure for purposes other than remedying health state, unspecified: Secondary | ICD-10-CM | POA: Diagnosis not present

## 2021-01-10 DIAGNOSIS — Z3201 Encounter for pregnancy test, result positive: Secondary | ICD-10-CM | POA: Diagnosis not present

## 2021-01-25 DIAGNOSIS — Z419 Encounter for procedure for purposes other than remedying health state, unspecified: Secondary | ICD-10-CM | POA: Diagnosis not present

## 2021-01-26 ENCOUNTER — Emergency Department (HOSPITAL_COMMUNITY)
Admission: EM | Admit: 2021-01-26 | Discharge: 2021-01-26 | Disposition: A | Discharge status: Home / Self Care | Payer: Medicaid Other | Attending: Emergency Medicine | Admitting: Emergency Medicine

## 2021-01-26 ENCOUNTER — Other Ambulatory Visit: Payer: Self-pay

## 2021-01-26 ENCOUNTER — Emergency Department (HOSPITAL_COMMUNITY): Payer: Medicaid Other

## 2021-01-26 ENCOUNTER — Encounter (HOSPITAL_COMMUNITY): Payer: Self-pay

## 2021-01-26 DIAGNOSIS — Z3A01 Less than 8 weeks gestation of pregnancy: Secondary | ICD-10-CM

## 2021-01-26 DIAGNOSIS — O99411 Diseases of the circulatory system complicating pregnancy, first trimester: Secondary | ICD-10-CM | POA: Diagnosis not present

## 2021-01-26 DIAGNOSIS — R079 Chest pain, unspecified: Secondary | ICD-10-CM | POA: Diagnosis not present

## 2021-01-26 DIAGNOSIS — Z3401 Encounter for supervision of normal first pregnancy, first trimester: Secondary | ICD-10-CM | POA: Diagnosis not present

## 2021-01-26 DIAGNOSIS — R0602 Shortness of breath: Secondary | ICD-10-CM | POA: Diagnosis not present

## 2021-01-26 DIAGNOSIS — R0789 Other chest pain: Secondary | ICD-10-CM

## 2021-01-26 DIAGNOSIS — O99891 Other specified diseases and conditions complicating pregnancy: Secondary | ICD-10-CM | POA: Diagnosis not present

## 2021-01-26 DIAGNOSIS — O26891 Other specified pregnancy related conditions, first trimester: Secondary | ICD-10-CM | POA: Diagnosis not present

## 2021-01-26 LAB — CBC WITH DIFFERENTIAL/PLATELET
Abs Immature Granulocytes: 0.04 10*3/uL (ref 0.00–0.07)
Basophils Absolute: 0.1 10*3/uL (ref 0.0–0.1)
Basophils Relative: 1 %
Eosinophils Absolute: 0.1 10*3/uL (ref 0.0–0.5)
Eosinophils Relative: 1 %
HCT: 38.6 % (ref 36.0–46.0)
Hemoglobin: 12.8 g/dL (ref 12.0–15.0)
Immature Granulocytes: 0 %
Lymphocytes Relative: 25 %
Lymphs Abs: 2.5 10*3/uL (ref 0.7–4.0)
MCH: 30.5 pg (ref 26.0–34.0)
MCHC: 33.2 g/dL (ref 30.0–36.0)
MCV: 91.9 fL (ref 80.0–100.0)
Monocytes Absolute: 0.5 10*3/uL (ref 0.1–1.0)
Monocytes Relative: 5 %
Neutro Abs: 6.7 10*3/uL (ref 1.7–7.7)
Neutrophils Relative %: 68 %
Platelets: 345 10*3/uL (ref 150–400)
RBC: 4.2 MIL/uL (ref 3.87–5.11)
RDW: 12.3 % (ref 11.5–15.5)
WBC: 9.9 10*3/uL (ref 4.0–10.5)
nRBC: 0 % (ref 0.0–0.2)

## 2021-01-26 LAB — BASIC METABOLIC PANEL
Anion gap: 7 (ref 5–15)
BUN: 10 mg/dL (ref 6–20)
CO2: 23 mmol/L (ref 22–32)
Calcium: 9 mg/dL (ref 8.9–10.3)
Chloride: 104 mmol/L (ref 98–111)
Creatinine, Ser: 0.59 mg/dL (ref 0.44–1.00)
GFR, Estimated: >60 mL/min (ref >60)
Glucose, Bld: 109 mg/dL — ABNORMAL HIGH (ref 70–99)
Potassium: 3.6 mmol/L (ref 3.5–5.1)
Sodium: 134 mmol/L — ABNORMAL LOW (ref 135–145)

## 2021-01-26 LAB — D-DIMER, QUANTITATIVE: D-Dimer, Quant: 0.35 ug/mL-FEU (ref 0.00–0.50)

## 2021-01-26 LAB — I-STAT BETA HCG BLOOD, ED (MC, WL, AP ONLY): I-stat hCG, quantitative: >2000 m[IU]/mL — ABNORMAL HIGH (ref <5)

## 2021-01-26 LAB — TROPONIN I (HIGH SENSITIVITY)
Troponin I (High Sensitivity): 3 ng/L (ref <18)
Troponin I (High Sensitivity): 3 ng/L (ref <18)

## 2021-01-26 MED ORDER — ACETAMINOPHEN 500 MG PO TABS
1000.0000 mg | ORAL_TABLET | Freq: Once | ORAL | Status: AC
  Administered 2021-01-26: 1000 mg via ORAL
  Filled 2021-01-26: qty 2

## 2021-01-26 NOTE — ED Provider Notes (Signed)
[facility] EMERGENCY DEPARTMENT Provider Note   CSN: [id number] Arrival date & time: 01/26/21  1659     History Chief Complaint  Patient presents with  . Chest Pain    Jill Conley is a 30 y.o. female.  Pt presents to the ED today with chest pain.  The pt said she has had cp for several months, but it was worse today.  Pt is approximately 6-[redacted] weeks pregnant with her 3rd child.  No PNC yet.  The pt denies any abdominal pain or vaginal bleeding.    Pt speaks [place] and wants her husband to interpret.          Past Medical History:  Diagnosis Date  . Medical history non-contributory     Patient Active Problem List   Diagnosis Date Noted  . Preterm uterine contractions 05/15/2019  . Pregnancy 05/15/2019    Past Surgical History:  Procedure Laterality Date  . NO PAST SURGERIES    . WISDOM TOOTH EXTRACTION  2020     OB History    Gravida  3   Para  2   Term  1   Preterm  1   AB      Living  2     SAB      IAB      Ectopic      Multiple  0   Live Births  2           No family history on file.  Social History   Tobacco Use  . Smoking status: Never Smoker  Substance Use Topics  . Alcohol use: Never  . Drug use: Never    Home Medications Prior to Admission medications   Medication Sig Start Date End Date Taking? Authorizing Provider  dibucaine (NUPERCAINAL) 1 % OINT Apply a small, pea-sized amount to painful external hemorrhoid, 3-4 times daily, as needed. 06/09/19   Joy, Shawn C, PA-C  docusate sodium (COLACE) 100 MG capsule Take 200 mg by mouth daily.     [provider]  hydrocortisone 2.5 % ointment Apply 1 application topically 2 (two) times daily as needed (hemorrhoids).  04/09/19   [provider]  ibuprofen (ADVIL) 600 MG tablet Take 1 tablet (600 mg total) by mouth every 6 (six) hours. 05/16/19   Levi Aland, MD  ofloxacin (FLOXIN) 0.3 % OTIC solution Place 5 drops into the right ear 2  (two) times daily. 05/22/19   Hyman Hopes, Margaux, PA-C  polyethylene glycol powder (GLYCOLAX/MIRALAX) 17 GM/SCOOP powder Take 17 g by mouth every other day. Mix in 8 oz liquid and drink 01/20/19   [provider]  Prenatal Vit-Fe Fumarate-FA (MULTIVITAMIN-PRENATAL) 27-0.8 MG TABS tablet Take 1 tablet by mouth daily.     [provider]  witch hazel-glycerin (TUCKS) pad Apply 1 application topically 4 (four) times daily as needed for hemorrhoids.     [provider]    Allergies    Patient has no known allergies.  Review of Systems   Review of Systems  Cardiovascular: Positive for chest pain.  All other systems reviewed and are negative.   Physical Exam Updated Vital Signs BP 101/64 (BP Location: Left Arm)   Pulse 64   Temp 97.7 F (36.5 C) (Oral)   Resp 16   LMP 11/29/2020   SpO2 98%   Breastfeeding No   Physical Exam Vitals and nursing note reviewed.  Constitutional:      Appearance: She is well-developed.  HENT:  Head: Normocephalic and atraumatic.  Eyes:     Extraocular Movements: Extraocular movements intact.     Pupils: Pupils are equal, round, and reactive to light.  Cardiovascular:     Rate and Rhythm: Normal rate and regular rhythm.     Heart sounds: Normal heart sounds.  Pulmonary:     Effort: Pulmonary effort is normal.     Breath sounds: Normal breath sounds.  Abdominal:     General: Bowel sounds are normal.     Palpations: Abdomen is soft.  Musculoskeletal:        General: Normal range of motion.     Cervical back: Normal range of motion and neck supple.  Skin:    General: Skin is warm and dry.     Capillary Refill: Capillary refill takes less than 2 seconds.  Neurological:     General: No focal deficit present.     Mental Status: She is alert and oriented to person, place, and time.  Psychiatric:        Mood and Affect: Mood normal.        Behavior: Behavior normal.     ED Results / Procedures / Treatments   Labs (all  labs ordered are listed, but only abnormal results are displayed) Labs Reviewed  BASIC METABOLIC PANEL - Abnormal; Notable for the following components:      Result Value   Sodium 134 (*)    Glucose, Bld 109 (*)    All other components within normal limits  I-STAT BETA HCG BLOOD, ED ([facility], [facility], [facility] ONLY) - Abnormal; Notable for the following components:   I-stat hCG, quantitative >2,000.0 (*)    All other components within normal limits  CBC WITH DIFFERENTIAL/PLATELET  D-DIMER, QUANTITATIVE  TROPONIN I (HIGH SENSITIVITY)  TROPONIN I (HIGH SENSITIVITY)    EKG EKG Interpretation  Date/Time:  Thursday January 26 2021 17:21:56 EDT Ventricular Rate:  70 PR Interval:  124 QRS Duration: 78 QT Interval:  380 QTC Calculation: 410 R Axis:   85 Text Interpretation: Normal sinus rhythm Normal ECG Confirmed by Jacalyn Lefevre [phone]) on 01/26/2021 9:09:33 PM   Radiology DG Chest 2 View  Result Date: 01/26/2021 CLINICAL DATA:  Chest pain and shortness of breath. EXAM: CHEST - 2 VIEW COMPARISON:  None. FINDINGS: The lungs are clear without focal pneumonia, edema, pneumothorax or pleural effusion. The cardiopericardial silhouette is within normal limits for size. Nodular density/densities projecting over the lungs are compatible with pads for telemetry leads. IMPRESSION: No active cardiopulmonary disease. Electronically Signed   By: Kennith Center M.D.   On: 01/26/2021 18:02    Procedures Procedures   Medications Ordered in ED Medications  acetaminophen (TYLENOL) tablet 1,000 mg (has no administration in time range)    ED Course  I have reviewed the triage vital signs and the nursing notes.  Pertinent labs & imaging results that were available during my care of the patient were reviewed by me and considered in my medical decision making (see chart for details).    MDM Rules/Calculators/A&P                          CP eval neg.  Ddimer neg.  CXR and EKG neg.  Suspect msk in nature.  Pt to  take tylenol for pain.  F/u with obgyn.   Return if worse. Final Clinical Impression(s) / ED Diagnoses Final diagnoses:  Atypical chest pain  Less than [redacted] weeks gestation of pregnancy  Rx / DC Orders ED Discharge Orders    None       Jacalyn Lefevre, MD 01/26/21 2255

## 2021-01-26 NOTE — ED Triage Notes (Signed)
Pt reports chest pain since February, worse when lying down. Pt is [redacted] weeks pregnant.

## 2021-01-26 NOTE — ED Provider Notes (Addendum)
Emergency Medicine Provider Triage Evaluation Note  Jill Conley , a 30 y.o. female  was evaluated in triage.  Pt complains of chest pain that has been intermittent for the past several months but worsened today.  Has been having intermittent shortness of breath as well.  She states that she is likely 6 to [redacted] weeks pregnant as resulted by urine pregnancy test recently.  Has not received prenatal care yet.  Denies any abdominal pain or vaginal bleeding.  No history of PE or DVT  Review of Systems  Positive: Chest pain, shortness of breath Negative: Abdominal pain, vaginal bleeding  Physical Exam  BP 112/64 (BP Location: Left Arm)   Pulse 71   Temp 98.4 F (36.9 C)   Resp 16   SpO2 100%  Gen:   Awake, no distress Resp:  Normal effort MSK:   Moves extremities without difficulty Other:  No acute respiratory distress  Medical Decision Making  Medically screening exam initiated at 5:22 PM.  Appropriate orders placed.  Shakiara Lukic was informed that the remainder of the evaluation will be completed by another provider, this initial triage assessment does not replace that evaluation, and the importance of remaining in the ED until their evaluation is complete.  Husband used as interpreter over the phone.  Will order lab work, EKG and troponin.  I feel that she will warrant evaluation in the ER rather than MAU as she is having chest pain rather than a pregnancy related concern.   I spoke to MAU provider but due to not pregnancy related concern they recommend being evaluated in the ER    Dietrich Pates, PA-C 01/26/21 1959    Gwyneth Sprout, MD 01/27/21 [phone]

## 2021-01-27 ENCOUNTER — Telehealth: Payer: Self-pay | Admitting: *Deleted

## 2021-01-27 NOTE — Telephone Encounter (Signed)
Transition Care Management Follow-up Telephone Call  Date of discharge and from where: 01/26/2021 - [facility]  How have you been since you were released from the hospital? "Fine"  Any questions or concerns? No  Items Reviewed:  Did the pt receive and understand the discharge instructions provided? Yes   Medications obtained and verified? Yes   Other? No   Any new allergies since your discharge? No   Dietary orders reviewed? No  Do you have support at home? Yes    Functional Questionnaire: (I = Independent and D = Dependent) ADLs: I  Bathing/Dressing- I  Meal Prep- I  Eating- I  Maintaining continence- I  Transferring/Ambulation- I  Managing Meds- I  Follow up appointments reviewed:   PCP Hospital f/u appt confirmed? No    Specialist Hospital f/u appt confirmed? No    Are transportation arrangements needed? No   If their condition worsens, is the pt aware to call PCP or go to the Emergency Dept.? Yes  Was the patient provided with contact information for the PCP's office or ED? Yes  Was to pt encouraged to call back with questions or concerns? Yes

## 2021-02-14 DIAGNOSIS — Z3480 Encounter for supervision of other normal pregnancy, unspecified trimester: Secondary | ICD-10-CM | POA: Diagnosis not present

## 2021-02-14 DIAGNOSIS — Z113 Encounter for screening for infections with a predominantly sexual mode of transmission: Secondary | ICD-10-CM | POA: Diagnosis not present

## 2021-02-14 DIAGNOSIS — O26841 Uterine size-date discrepancy, first trimester: Secondary | ICD-10-CM | POA: Diagnosis not present

## 2021-02-14 LAB — OB RESULTS CONSOLE GC/CHLAMYDIA
Chlamydia: NEGATIVE
Gonorrhea: NEGATIVE

## 2021-02-14 LAB — OB RESULTS CONSOLE RUBELLA ANTIBODY, IGM: Rubella: IMMUNE

## 2021-02-14 LAB — OB RESULTS CONSOLE HIV ANTIBODY (ROUTINE TESTING): HIV: NONREACTIVE

## 2021-02-14 LAB — OB RESULTS CONSOLE RPR: RPR: NONREACTIVE

## 2021-02-14 LAB — OB RESULTS CONSOLE HEPATITIS B SURFACE ANTIGEN: Hepatitis B Surface Ag: NEGATIVE

## 2021-02-14 LAB — HEPATITIS C ANTIBODY: HCV Ab: NEGATIVE

## 2021-02-24 DIAGNOSIS — Z419 Encounter for procedure for purposes other than remedying health state, unspecified: Secondary | ICD-10-CM | POA: Diagnosis not present

## 2021-03-27 DIAGNOSIS — Z419 Encounter for procedure for purposes other than remedying health state, unspecified: Secondary | ICD-10-CM | POA: Diagnosis not present

## 2021-04-27 DIAGNOSIS — Z419 Encounter for procedure for purposes other than remedying health state, unspecified: Secondary | ICD-10-CM | POA: Diagnosis not present

## 2021-05-27 DIAGNOSIS — Z419 Encounter for procedure for purposes other than remedying health state, unspecified: Secondary | ICD-10-CM | POA: Diagnosis not present

## 2021-06-27 DIAGNOSIS — Z419 Encounter for procedure for purposes other than remedying health state, unspecified: Secondary | ICD-10-CM | POA: Diagnosis not present

## 2021-07-03 DIAGNOSIS — Z3480 Encounter for supervision of other normal pregnancy, unspecified trimester: Secondary | ICD-10-CM | POA: Diagnosis not present

## 2021-07-03 DIAGNOSIS — Z3A29 29 weeks gestation of pregnancy: Secondary | ICD-10-CM | POA: Diagnosis not present

## 2021-07-03 DIAGNOSIS — Z363 Encounter for antenatal screening for malformations: Secondary | ICD-10-CM | POA: Diagnosis not present

## 2021-07-07 ENCOUNTER — Other Ambulatory Visit: Payer: Self-pay | Admitting: Obstetrics and Gynecology

## 2021-07-07 DIAGNOSIS — Z363 Encounter for antenatal screening for malformations: Secondary | ICD-10-CM

## 2021-07-18 ENCOUNTER — Other Ambulatory Visit: Payer: Self-pay

## 2021-07-24 ENCOUNTER — Encounter: Payer: Self-pay | Admitting: *Deleted

## 2021-07-27 ENCOUNTER — Ambulatory Visit: Payer: Medicaid Other | Admitting: *Deleted

## 2021-07-27 ENCOUNTER — Ambulatory Visit: Payer: Medicaid Other | Attending: Obstetrics and Gynecology

## 2021-07-27 ENCOUNTER — Other Ambulatory Visit: Payer: Self-pay

## 2021-07-27 ENCOUNTER — Encounter: Payer: Self-pay | Admitting: *Deleted

## 2021-07-27 VITALS — BP 103/58 | HR 63

## 2021-07-27 DIAGNOSIS — O09899 Supervision of other high risk pregnancies, unspecified trimester: Secondary | ICD-10-CM | POA: Diagnosis not present

## 2021-07-27 DIAGNOSIS — Z363 Encounter for antenatal screening for malformations: Secondary | ICD-10-CM | POA: Diagnosis not present

## 2021-07-27 DIAGNOSIS — Z419 Encounter for procedure for purposes other than remedying health state, unspecified: Secondary | ICD-10-CM | POA: Diagnosis not present

## 2021-08-04 ENCOUNTER — Inpatient Hospital Stay (HOSPITAL_COMMUNITY)
Admission: EM | Admit: 2021-08-04 | Discharge: 2021-08-04 | Discharge status: Against Medical Advice | Payer: Medicaid Other | Attending: Obstetrics and Gynecology | Admitting: Obstetrics and Gynecology

## 2021-08-04 ENCOUNTER — Encounter (HOSPITAL_COMMUNITY): Payer: Self-pay | Admitting: Obstetrics and Gynecology

## 2021-08-04 ENCOUNTER — Other Ambulatory Visit: Payer: Self-pay

## 2021-08-04 DIAGNOSIS — H9201 Otalgia, right ear: Secondary | ICD-10-CM | POA: Diagnosis not present

## 2021-08-04 DIAGNOSIS — O26893 Other specified pregnancy related conditions, third trimester: Secondary | ICD-10-CM | POA: Diagnosis not present

## 2021-08-04 DIAGNOSIS — K5900 Constipation, unspecified: Secondary | ICD-10-CM | POA: Diagnosis not present

## 2021-08-04 DIAGNOSIS — O09213 Supervision of pregnancy with history of pre-term labor, third trimester: Secondary | ICD-10-CM | POA: Insufficient documentation

## 2021-08-04 DIAGNOSIS — O4703 False labor before 37 completed weeks of gestation, third trimester: Secondary | ICD-10-CM | POA: Diagnosis not present

## 2021-08-04 DIAGNOSIS — R109 Unspecified abdominal pain: Secondary | ICD-10-CM | POA: Diagnosis not present

## 2021-08-04 DIAGNOSIS — R102 Pelvic and perineal pain: Secondary | ICD-10-CM | POA: Diagnosis not present

## 2021-08-04 DIAGNOSIS — O99613 Diseases of the digestive system complicating pregnancy, third trimester: Secondary | ICD-10-CM | POA: Insufficient documentation

## 2021-08-04 DIAGNOSIS — Z3A33 33 weeks gestation of pregnancy: Secondary | ICD-10-CM

## 2021-08-04 LAB — WET PREP, GENITAL
Clue Cells Wet Prep HPF POC: NONE SEEN
Sperm: NONE SEEN
Trich, Wet Prep: NONE SEEN
WBC, Wet Prep HPF POC: <10 (ref <10)
Yeast Wet Prep HPF POC: NONE SEEN

## 2021-08-04 LAB — URINALYSIS, ROUTINE W REFLEX MICROSCOPIC
Bilirubin Urine: NEGATIVE
Glucose, UA: NEGATIVE mg/dL
Hgb urine dipstick: NEGATIVE
Ketones, ur: NEGATIVE mg/dL
Leukocytes,Ua: NEGATIVE
Nitrite: NEGATIVE
Protein, ur: NEGATIVE mg/dL
Specific Gravity, Urine: 1.009 (ref 1.005–1.030)
pH: 5 (ref 5.0–8.0)

## 2021-08-04 LAB — AMNISURE RUPTURE OF MEMBRANE (ROM) NOT AT ARMC: Amnisure ROM: NEGATIVE

## 2021-08-04 MED ORDER — CYCLOBENZAPRINE HCL 5 MG PO TABS
10.0000 mg | ORAL_TABLET | Freq: Once | ORAL | Status: DC
  Filled 2021-08-04: qty 2

## 2021-08-04 MED ORDER — ACETAMINOPHEN 500 MG PO TABS
1000.0000 mg | ORAL_TABLET | Freq: Once | ORAL | Status: AC
  Administered 2021-08-04: 1000 mg via ORAL
  Filled 2021-08-04: qty 2

## 2021-08-04 MED ORDER — LACTATED RINGERS IV BOLUS: 1000.0000 mL | Freq: Once | INTRAVENOUS | Status: DC

## 2021-08-04 MED ORDER — NIFEDIPINE 10 MG PO CAPS: 10.0000 mg | ORAL_CAPSULE | ORAL | Status: DC | PRN

## 2021-08-04 NOTE — MAU Note (Signed)
As RN was about to administer Flexeril, Tylenol, and insert an IV with pt's husband as the interpreter, pt informs me that she does not want to take medication.  Pt asked if taking the medications and/or getting the IV will stop her contractions.  I explained to the pt that it can stop her contractions if she's dehydrated in regards to receiving IV fluids.  The provider is recommending medications that can stop the contractions which will Both her and her husband state that they do not want anything that prevent her from going into labor if that it what is suppose to happen.  Pt agreed to taking the Tylenol but declined Flexeril and IV.  With Clayton Bibles, CNM at bedside. Pt and her husband are explained in detail the importance of preventing a preterm delivery if we can and while if they decide to not agree to medical recommendations the provider advised the pt to sign AMA form.  Both pt and pt's husband agree to sign AMA form.

## 2021-08-04 NOTE — ED Triage Notes (Signed)
Pt states she is 8 months pregnant c/o constipation and R ear pain. Pt endorses abdominal pain, no vaginal bleeding or urge to push. Pt states she felt leaking of vaginal fluids while being triaged. G3P2.

## 2021-08-04 NOTE — MAU Note (Signed)
Pt transferred from ED with c/o sharp abdominal pain that is in the lower abdomen.  Pt reports that she is constipated however had a BM today.  Pt states that she takes medicine but she can still go 5-7 days with a BM.   Pt denies leaking of fluid today but had for the past two days.  +FM

## 2021-08-04 NOTE — ED Provider Notes (Signed)
Emergency Medicine Provider Triage Evaluation Note  Jill Conley , a 30 y.o. female  was evaluated in triage.  Pt complains of abdominal pain intermittently for the past two days and then today for the past two hours. The patient reports she is still feeling baby move. Felt some fluid leaking prior to me walking in the room. Denies any vaginal bleeding. Third pregnancy. The patient reports she is approximately 8 months pregnant.   Interpreter service used.   Review of Systems  Positive: Right ear pain, leaking fluids Negative: Vaginal bleeding  Physical Exam  BP 110/62 (BP Location: Right Arm)   Pulse 80   Temp 98.4 F (36.9 C)   Resp 18   LMP 11/29/2020   SpO2 97%  Gen:   Awake, no distress   Resp:  Normal effort  MSK:   Moves extremities without difficulty  Other:  Gravidous abdomen. Right ear shows some cerumen, but no signs of OM.  Medical Decision Making  Medically screening exam initiated at 2:43 PM.  Appropriate orders placed.  Veneta Sliter was informed that the remainder of the evaluation will be completed by another provider, this initial triage assessment does not replace that evaluation, and the importance of remaining in the ED until their evaluation is complete.  MAU APP called and the patient is being transported up   Achille Rich, PA-C 08/04/21 1453    Trifan, Kermit Balo, MD 08/04/21 1704

## 2021-08-04 NOTE — MAU Provider Note (Signed)
History     CSN: [id number]  Arrival date and time: 08/04/21 1250   Event Date/Time   First Provider Initiated Contact with Patient 08/04/21 1645      Chief Complaint  Patient presents with   Abdominal Pain   Constipation   HPI Jill Conley is a 30 y.o. [id number] at [redacted]w[redacted]d who presents to MAU from [facility] for evaluation of multiple complaints:  Leaking of Fluid This is a new problem, onset around 1430 today while being triaged for other complaints. She denies ongoing leaking of fluid. She is remote from sexual intercourse.  Abdominal and Pelvic Pain These are new problems, onset within the past few days. Patient's pain is suprapubic. Pain score is 7/10. Pain is aggravated by walking and standing. She has not taken medication or tried other treatments for this complaint.  Chronic Right Ear Pain in Pregnancy Patient states she has pain "when I stick my fingers in my ear it feels like something is in there" and she can hear a "crackling" from inside her head. She states this sensation only occurs in pregnancy. She was prescribed a medication during her 2020 pregnancy which resolved with a specific medicine. She has not been able to get a new prescription for this same medicine in her current pregnancy.   Constipation Patient reports going up to 7 days between bowel movements. Her most recent bowel movement was this morning. She states she has difficulty drinking water and is trying to consume more throughout the day.  Patient receives prenatal care with [facility].  OB History     Gravida  3   Para  2   Term  1   Preterm  1   AB      Living  2      SAB      IAB      Ectopic      Multiple  0   Live Births  2           Past Medical History:  Diagnosis Date   Medical history non-contributory     Past Surgical History:  Procedure Laterality Date   NO PAST SURGERIES     WISDOM TOOTH EXTRACTION  2020    Family History  Problem Relation Age of Onset    Diabetes Neg Hx    Hypertension Neg Hx     Social History   Tobacco Use   Smoking status: Never  Vaping Use   Vaping Use: Never used  Substance Use Topics   Alcohol use: Never   Drug use: Never    Allergies: No Known Allergies  Medications Prior to Admission  Medication Sig Dispense Refill Last Dose   dibucaine (NUPERCAINAL) 1 % OINT Apply a small, pea-sized amount to painful external hemorrhoid, 3-4 times daily, as needed. 28 g 1    docusate sodium (COLACE) 100 MG capsule Take 200 mg by mouth daily.       hydrocortisone 2.5 % ointment Apply 1 application topically 2 (two) times daily as needed (hemorrhoids).       ofloxacin (FLOXIN) 0.3 % OTIC solution Place 5 drops into the right ear 2 (two) times daily. 5 mL 0    polyethylene glycol powder (GLYCOLAX/MIRALAX) 17 GM/SCOOP powder Take 17 g by mouth every other day. Mix in 8 oz liquid and drink      Prenatal Vit-Fe Fumarate-FA (MULTIVITAMIN-PRENATAL) 27-0.8 MG TABS tablet Take 1 tablet by mouth daily.       witch hazel-glycerin (TUCKS) pad  Apply 1 application topically 4 (four) times daily as needed for hemorrhoids.        Review of Systems  Gastrointestinal:  Positive for abdominal pain and constipation.  Genitourinary:  Positive for vaginal discharge.  All other systems reviewed and are negative. Physical Exam   Blood pressure 111/70, pulse 67, temperature 98.4 F (36.9 C), resp. rate 18, last menstrual period 11/29/2020, SpO2 100 %.  Physical Exam Vitals and nursing note reviewed. Exam conducted with a chaperone present.  Constitutional:      Appearance: She is well-developed.  Cardiovascular:     Rate and Rhythm: Normal rate and regular rhythm.     Heart sounds: Normal heart sounds.  Pulmonary:     Effort: Pulmonary effort is normal.     Breath sounds: Normal breath sounds.  Abdominal:     Comments: Gravid  Genitourinary:    Comments: Pelvic exam: External genitalia normal, vaginal walls pink and well rugated,  cervix visually closed, no lesions noted. Bimanual: cervix 0/thick/posterior, neg CMT, uterus nontender, no adnexal tenderness noted.   Neurological:     Mental Status: She is alert.    MAU Course/MDM  Procedures  --CNM at bedside for discussion of interventions for preterm contractions at 33w 5d. Discussed variations in newborn competencies at 33w vs term. Patient intermittently answering questions but also redirecting conversation to ear pain. Verbalizes intent to leave AMA. Declines all interventions for preterm contractions. Verbalizes to [employer] and CNM that she hopes to have a preterm baby and hopes to not be pregnant next week.  --Reactive tracing: baseline 120, mod var, + accels, no decels  --Toco: irregular contractions q 2-4 min  --Patient left AMA prior to examination of ears  Orders Placed This Encounter  Procedures   Wet prep, genital   Amnisure rupture of membrane (rom)not at [facility]   Urinalysis, Routine w reflex microscopic Urine, Clean Catch   Insert peripheral IV   Patient Vitals for the past 24 hrs:  BP Temp Temp src Pulse Resp SpO2  08/04/21 1535 111/70 -- Oral 67 18 100 %  08/04/21 1442 110/62 98.4 F (36.9 C) -- 80 18 97 %  08/04/21 1253 110/62 98.4 F (36.9 C) -- 80 18 97 %   Meds ordered this encounter  Medications   acetaminophen (TYLENOL) tablet 1,000 mg   cyclobenzaprine (FLEXERIL) tablet 10 mg   lactated ringers bolus 1,000 mL   DISCONTD: NIFEdipine (PROCARDIA) capsule 10 mg    Results for orders placed or performed during the hospital encounter of 08/04/21 (from the past 24 hour(s))  Urinalysis, Routine w reflex microscopic Urine, Clean Catch     Status: None   Collection Time: 08/04/21  3:56 PM  Result Value Ref Range   Color, Urine YELLOW YELLOW   APPearance CLEAR CLEAR   Specific Gravity, Urine 1.009 1.005 - 1.030   pH 5.0 5.0 - 8.0   Glucose, UA NEGATIVE NEGATIVE mg/dL   Hgb urine dipstick NEGATIVE NEGATIVE   Bilirubin Urine NEGATIVE  NEGATIVE   Ketones, ur NEGATIVE NEGATIVE mg/dL   Protein, ur NEGATIVE NEGATIVE mg/dL   Nitrite NEGATIVE NEGATIVE   Leukocytes,Ua NEGATIVE NEGATIVE  Amnisure rupture of membrane (rom)not at [facility]     Status: None   Collection Time: 08/04/21  4:12 PM  Result Value Ref Range   Amnisure ROM NEGATIVE   Wet prep, genital     Status: None   Collection Time: 08/04/21  4:12 PM  Result Value Ref Range   Yeast Wet Prep  HPF POC NONE SEEN NONE SEEN   Trich, Wet Prep NONE SEEN NONE SEEN   Clue Cells Wet Prep HPF POC NONE SEEN NONE SEEN   WBC, Wet Prep HPF POC <10 <10   Sperm NONE SEEN     Assessment and Plan  --30 y.o. [id number] at [redacted]w[redacted]d  --Intact Amniotic sac --All interventions for preterm contractions declined --Language barrier: iPad interpreter initially used for patient interaction --Patient signed consent with RN to have husband provide interpretation services --Patient left AMA after lengthy discussion with CNM of concerns for preterm labor, hx of preterm birth  Calvert Cantor, [place] 08/04/2021, 7:35 PM

## 2021-08-17 DIAGNOSIS — Z3480 Encounter for supervision of other normal pregnancy, unspecified trimester: Secondary | ICD-10-CM | POA: Diagnosis not present

## 2021-08-18 ENCOUNTER — Other Ambulatory Visit: Payer: Self-pay

## 2021-08-18 ENCOUNTER — Inpatient Hospital Stay (HOSPITAL_COMMUNITY): Payer: Medicaid Other | Admitting: Anesthesiology

## 2021-08-18 ENCOUNTER — Encounter (HOSPITAL_COMMUNITY): Payer: Self-pay | Admitting: Obstetrics and Gynecology

## 2021-08-18 ENCOUNTER — Inpatient Hospital Stay (HOSPITAL_COMMUNITY)
Admission: EM | Admit: 2021-08-18 | Discharge: 2021-08-20 | DRG: 806 | Disposition: A | Discharge status: Home / Self Care | Payer: Medicaid Other | Attending: Obstetrics and Gynecology | Admitting: Obstetrics and Gynecology

## 2021-08-18 DIAGNOSIS — Z20822 Contact with and (suspected) exposure to covid-19: Secondary | ICD-10-CM | POA: Diagnosis not present

## 2021-08-18 DIAGNOSIS — Z3A35 35 weeks gestation of pregnancy: Secondary | ICD-10-CM | POA: Diagnosis not present

## 2021-08-18 DIAGNOSIS — O4700 False labor before 37 completed weeks of gestation, unspecified trimester: Secondary | ICD-10-CM | POA: Diagnosis present

## 2021-08-18 DIAGNOSIS — K644 Residual hemorrhoidal skin tags: Secondary | ICD-10-CM

## 2021-08-18 DIAGNOSIS — O2243 Hemorrhoids in pregnancy, third trimester: Secondary | ICD-10-CM | POA: Diagnosis present

## 2021-08-18 DIAGNOSIS — O479 False labor, unspecified: Secondary | ICD-10-CM

## 2021-08-18 LAB — CBC
HCT: 36 % (ref 36.0–46.0)
Hemoglobin: 12.1 g/dL (ref 12.0–15.0)
MCH: 30.9 pg (ref 26.0–34.0)
MCHC: 33.6 g/dL (ref 30.0–36.0)
MCV: 91.8 fL (ref 80.0–100.0)
Platelets: 251 10*3/uL (ref 150–400)
RBC: 3.92 MIL/uL (ref 3.87–5.11)
RDW: 13.2 % (ref 11.5–15.5)
WBC: 10.1 10*3/uL (ref 4.0–10.5)
nRBC: 0 % (ref 0.0–0.2)

## 2021-08-18 LAB — RESP PANEL BY RT-PCR (FLU A&B, COVID) ARPGX2
Influenza A by PCR: NEGATIVE
Influenza B by PCR: NEGATIVE
SARS Coronavirus 2 by RT PCR: NEGATIVE

## 2021-08-18 LAB — TYPE AND SCREEN
ABO/RH(D): A POS
Antibody Screen: NEGATIVE

## 2021-08-18 LAB — RPR: RPR Ser Ql: NONREACTIVE

## 2021-08-18 MED ORDER — SODIUM CHLORIDE 0.9 % IV SOLN
1.0000 g | INTRAVENOUS | Status: DC
  Administered 2021-08-18: 13:00:00 1 g via INTRAVENOUS
  Filled 2021-08-18: qty 1000

## 2021-08-18 MED ORDER — OXYCODONE HCL 5 MG PO TABS: 10.0000 mg | ORAL_TABLET | ORAL | Status: DC | PRN

## 2021-08-18 MED ORDER — SODIUM CHLORIDE 0.9 % IV SOLN
2.0000 g | Freq: Once | INTRAVENOUS | Status: AC
  Administered 2021-08-18: 08:00:00 2 g via INTRAVENOUS
  Filled 2021-08-18: qty 2000

## 2021-08-18 MED ORDER — SENNOSIDES-DOCUSATE SODIUM 8.6-50 MG PO TABS
2.0000 | ORAL_TABLET | Freq: Every day | ORAL | Status: DC
  Administered 2021-08-19 – 2021-08-20 (×2): 2 via ORAL
  Filled 2021-08-18 (×2): qty 2

## 2021-08-18 MED ORDER — LACTATED RINGERS IV SOLN: INTRAVENOUS | Status: DC

## 2021-08-18 MED ORDER — IBUPROFEN 600 MG PO TABS
600.0000 mg | ORAL_TABLET | Freq: Four times a day (QID) | ORAL | Status: DC
  Administered 2021-08-18 – 2021-08-20 (×7): 600 mg via ORAL
  Filled 2021-08-18 (×8): qty 1

## 2021-08-18 MED ORDER — OXYCODONE-ACETAMINOPHEN 5-325 MG PO TABS: 1.0000 | ORAL_TABLET | ORAL | Status: DC | PRN

## 2021-08-18 MED ORDER — MAGNESIUM HYDROXIDE 400 MG/5ML PO SUSP: 30.0000 mL | ORAL | Status: DC | PRN

## 2021-08-18 MED ORDER — EPHEDRINE 5 MG/ML INJ: 10.0000 mg | INTRAVENOUS | Status: DC | PRN

## 2021-08-18 MED ORDER — BENZOCAINE-MENTHOL 20-0.5 % EX AERO
1.0000 "application " | INHALATION_SPRAY | Freq: Four times a day (QID) | CUTANEOUS | Status: DC | PRN
  Administered 2021-08-18 (×2): 1 via TOPICAL
  Filled 2021-08-18: qty 56

## 2021-08-18 MED ORDER — ACETAMINOPHEN 325 MG PO TABS: 650.0000 mg | ORAL_TABLET | ORAL | Status: DC | PRN

## 2021-08-18 MED ORDER — LACTATED RINGERS IV SOLN
500.0000 mL | Freq: Once | INTRAVENOUS | Status: AC
  Administered 2021-08-18: 09:00:00 500 mL via INTRAVENOUS

## 2021-08-18 MED ORDER — OXYTOCIN BOLUS FROM INFUSION
333.0000 mL | Freq: Once | INTRAVENOUS | Status: AC
  Administered 2021-08-18: 16:00:00 333 mL via INTRAVENOUS

## 2021-08-18 MED ORDER — DIPHENHYDRAMINE HCL 25 MG PO CAPS: 25.0000 mg | ORAL_CAPSULE | Freq: Four times a day (QID) | ORAL | Status: DC | PRN

## 2021-08-18 MED ORDER — BETAMETHASONE SOD PHOS & ACET 6 (3-3) MG/ML IJ SUSP
12.0000 mg | INTRAMUSCULAR | Status: DC
  Administered 2021-08-18: 06:00:00 12 mg via INTRAMUSCULAR
  Filled 2021-08-18: qty 5

## 2021-08-18 MED ORDER — COCONUT OIL OIL
1.0000 "application " | TOPICAL_OIL | Status: DC | PRN
  Administered 2021-08-19: 1 via TOPICAL

## 2021-08-18 MED ORDER — SOD CITRATE-CITRIC ACID 500-334 MG/5ML PO SOLN: 30.0000 mL | ORAL | Status: DC | PRN

## 2021-08-18 MED ORDER — METHYLERGONOVINE MALEATE 0.2 MG/ML IJ SOLN: 0.2000 mg | INTRAMUSCULAR | Status: DC | PRN

## 2021-08-18 MED ORDER — OXYTOCIN-SODIUM CHLORIDE 30-0.9 UT/500ML-% IV SOLN
2.5000 [IU]/h | INTRAVENOUS | Status: DC
  Filled 2021-08-18: qty 500

## 2021-08-18 MED ORDER — ONDANSETRON HCL 4 MG PO TABS: 4.0000 mg | ORAL_TABLET | ORAL | Status: DC | PRN

## 2021-08-18 MED ORDER — PHENYLEPHRINE 40 MCG/ML (10ML) SYRINGE FOR IV PUSH (FOR BLOOD PRESSURE SUPPORT)
80.0000 ug | PREFILLED_SYRINGE | INTRAVENOUS | Status: DC | PRN
  Administered 2021-08-18 (×2): 80 ug via INTRAVENOUS
  Filled 2021-08-18: qty 10

## 2021-08-18 MED ORDER — OXYCODONE-ACETAMINOPHEN 5-325 MG PO TABS: 2.0000 | ORAL_TABLET | ORAL | Status: DC | PRN

## 2021-08-18 MED ORDER — DIBUCAINE (PERIANAL) 1 % EX OINT
1.0000 "application " | TOPICAL_OINTMENT | CUTANEOUS | Status: DC | PRN
  Filled 2021-08-18: qty 28

## 2021-08-18 MED ORDER — TETANUS-DIPHTH-ACELL PERTUSSIS 5-2.5-18.5 LF-MCG/0.5 IM SUSY: 0.5000 mL | PREFILLED_SYRINGE | Freq: Once | INTRAMUSCULAR | Status: DC

## 2021-08-18 MED ORDER — ONDANSETRON HCL 4 MG/2ML IJ SOLN: 4.0000 mg | INTRAMUSCULAR | Status: DC | PRN

## 2021-08-18 MED ORDER — FENTANYL-BUPIVACAINE-NACL 0.5-0.125-0.9 MG/250ML-% EP SOLN: 12.0000 mL/h | EPIDURAL | Status: DC | PRN

## 2021-08-18 MED ORDER — OXYCODONE HCL 5 MG PO TABS: 5.0000 mg | ORAL_TABLET | ORAL | Status: DC | PRN

## 2021-08-18 MED ORDER — LIDOCAINE HCL (PF) 1 % IJ SOLN: 30.0000 mL | INTRAMUSCULAR | Status: DC | PRN

## 2021-08-18 MED ORDER — LACTATED RINGERS IV BOLUS
1000.0000 mL | Freq: Once | INTRAVENOUS | Status: AC
  Administered 2021-08-18: 05:00:00 1000 mL via INTRAVENOUS

## 2021-08-18 MED ORDER — FLEET ENEMA 7-19 GM/118ML RE ENEM: 1.0000 | ENEMA | RECTAL | Status: DC | PRN

## 2021-08-18 MED ORDER — LACTATED RINGERS IV SOLN: 500.0000 mL | INTRAVENOUS | Status: DC | PRN

## 2021-08-18 MED ORDER — PRENATAL MULTIVITAMIN CH
1.0000 | ORAL_TABLET | Freq: Every day | ORAL | Status: DC
  Administered 2021-08-19 – 2021-08-20 (×2): 1 via ORAL
  Filled 2021-08-18 (×2): qty 1

## 2021-08-18 MED ORDER — ACETAMINOPHEN 325 MG PO TABS
650.0000 mg | ORAL_TABLET | ORAL | Status: DC | PRN
  Administered 2021-08-19 – 2021-08-20 (×3): 650 mg via ORAL
  Filled 2021-08-18 (×3): qty 2

## 2021-08-18 MED ORDER — BENZOCAINE-MENTHOL 20-0.5 % EX AERO
1.0000 "application " | INHALATION_SPRAY | CUTANEOUS | Status: DC | PRN
  Filled 2021-08-18: qty 56

## 2021-08-18 MED ORDER — FENTANYL-BUPIVACAINE-NACL 0.5-0.125-0.9 MG/250ML-% EP SOLN
12.0000 mL/h | EPIDURAL | Status: DC | PRN
  Administered 2021-08-18: 09:00:00 12 mL/h via EPIDURAL
  Filled 2021-08-18: qty 250

## 2021-08-18 MED ORDER — SIMETHICONE 80 MG PO CHEW: 80.0000 mg | CHEWABLE_TABLET | ORAL | Status: DC | PRN

## 2021-08-18 MED ORDER — DIPHENHYDRAMINE HCL 50 MG/ML IJ SOLN: 12.5000 mg | INTRAMUSCULAR | Status: DC | PRN

## 2021-08-18 MED ORDER — WITCH HAZEL-GLYCERIN EX PADS: 1.0000 "application " | MEDICATED_PAD | CUTANEOUS | Status: DC | PRN

## 2021-08-18 MED ORDER — MEASLES, MUMPS & RUBELLA VAC IJ SOLR: 0.5000 mL | Freq: Once | INTRAMUSCULAR | Status: DC

## 2021-08-18 MED ORDER — DIPHENHYDRAMINE HCL 50 MG/ML IJ SOLN
12.5000 mg | INTRAMUSCULAR | Status: DC | PRN
  Administered 2021-08-18: 13:00:00 12.5 mg via INTRAVENOUS
  Filled 2021-08-18: qty 1

## 2021-08-18 MED ORDER — ZOLPIDEM TARTRATE 5 MG PO TABS: 5.0000 mg | ORAL_TABLET | Freq: Every evening | ORAL | Status: DC | PRN

## 2021-08-18 MED ORDER — ONDANSETRON HCL 4 MG/2ML IJ SOLN: 4.0000 mg | Freq: Four times a day (QID) | INTRAMUSCULAR | Status: DC | PRN

## 2021-08-18 MED ORDER — METHYLERGONOVINE MALEATE 0.2 MG PO TABS: 0.2000 mg | ORAL_TABLET | ORAL | Status: DC | PRN

## 2021-08-18 MED ORDER — PHENYLEPHRINE 40 MCG/ML (10ML) SYRINGE FOR IV PUSH (FOR BLOOD PRESSURE SUPPORT)
80.0000 ug | PREFILLED_SYRINGE | INTRAVENOUS | Status: DC | PRN
  Administered 2021-08-18: 09:00:00 80 ug via INTRAVENOUS

## 2021-08-18 MED ORDER — LIDOCAINE HCL (PF) 1 % IJ SOLN
INTRAMUSCULAR | Status: DC | PRN
  Administered 2021-08-18: 8 mL via EPIDURAL

## 2021-08-18 NOTE — MAU Provider Note (Signed)
History     CSN: [id number]  Arrival date and time: 08/18/21 0036   None    Chief Complaint  Patient presents with   Hemorrhoids   Constipation   HPI Jill Conley is a 30 y.o. [id number] at [redacted]w[redacted]d who presents to MAU for hemorrhoids, constipation, and contractions. Pregnancy significant for previous preterm delivery at 35 weeks. Patient reports hemorrhoids and constipation have been an ongoing issues, but has gotten worse over the last 3 weeks. She reports that she does strain during BM's. Had a BM earlier today. She reports that she has tried miralax, colace, sitz baths, and 2 different hemorrhoid creams that were prescribed without any relief. She denies rectal bleeding, but endorses a lot of pain and itching. Reports that the only thing that helps relieve her constipation and hemorrhoid pain is when she takes castor oil. Patient also reports lower abdominal pain that started last week. Reports pain is intermittent and feels like contractions. The pain is not as painful as it was last week however pain is more frequent and has more pelvic pressure. Reports she was in the office earlier today and cervix was closed. She denies vaginal bleeding or leaking fluid. Endorses active fetal movement.   Patient receives care at [facility].   OB History     Gravida  3   Para  2   Term  1   Preterm  1   AB      Living  2      SAB      IAB      Ectopic      Multiple  0   Live Births  2           Past Medical History:  Diagnosis Date   Medical history non-contributory     Past Surgical History:  Procedure Laterality Date   NO PAST SURGERIES     WISDOM TOOTH EXTRACTION  2020    Family History  Problem Relation Age of Onset   Diabetes Neg Hx    Hypertension Neg Hx     Social History   Tobacco Use   Smoking status: Never  Vaping Use   Vaping Use: Never used  Substance Use Topics   Alcohol use: Never   Drug use: Never    Allergies: No Known  Allergies  Medications Prior to Admission  Medication Sig Dispense Refill Last Dose   docusate sodium (COLACE) 100 MG capsule Take 200 mg by mouth daily.    08/17/2021   hydrocortisone 2.5 % ointment Apply 1 application topically 2 (two) times daily as needed (hemorrhoids).    08/17/2021   magnesium 30 MG tablet Take 30 mg by mouth 2 (two) times daily.   08/17/2021   polyethylene glycol powder (GLYCOLAX/MIRALAX) 17 GM/SCOOP powder Take 17 g by mouth every other day. Mix in 8 oz liquid and drink   08/17/2021   Prenatal Vit-Fe Fumarate-FA (MULTIVITAMIN-PRENATAL) 27-0.8 MG TABS tablet Take 1 tablet by mouth daily.    08/17/2021   witch hazel-glycerin (TUCKS) pad Apply 1 application topically 4 (four) times daily as needed for hemorrhoids.    08/17/2021   dibucaine (NUPERCAINAL) 1 % OINT Apply a small, pea-sized amount to painful external hemorrhoid, 3-4 times daily, as needed. 28 g 1    ofloxacin (FLOXIN) 0.3 % OTIC solution Place 5 drops into the right ear 2 (two) times daily. 5 mL 0     Review of Systems  Constitutional: Negative.   Respiratory: Negative.  Cardiovascular: Negative.   Gastrointestinal:  Positive for abdominal pain, constipation and rectal pain. Negative for anal bleeding and blood in stool.  Genitourinary: Negative.   Musculoskeletal: Negative.   Neurological: Negative.    Physical Exam   Blood pressure (!) 104/50, pulse 70, temperature 98.1 F (36.7 C), temperature source Oral, resp. rate 18, height 5\' 3"  (1.6 m), weight 77.2 kg, last menstrual period 11/29/2020, SpO2 99 %.  Physical Exam Vitals and nursing note reviewed. Exam conducted with a chaperone present.  Constitutional:      General: She is not in acute distress. Eyes:     Pupils: Pupils are equal, round, and reactive to light.  Cardiovascular:     Rate and Rhythm: Normal rate.  Pulmonary:     Effort: Pulmonary effort is normal.  Abdominal:     Palpations: Abdomen is soft.     Tenderness: There is no  abdominal tenderness.     Comments: Gravid   Genitourinary:    General: Normal vulva.     Labia:        Right: No lesion.        Left: No lesion.      Rectum: External hemorrhoid present.     Comments: Hemorrhoid is not thrombosed.  Cervix: 1/50/-2, cephalic Musculoskeletal:        General: Normal range of motion.     Cervical back: Normal range of motion.  Skin:    General: Skin is warm and dry.  Neurological:     General: No focal deficit present.     Mental Status: She is alert and oriented to person, place, and time.  Psychiatric:        Mood and Affect: Mood normal.        Behavior: Behavior normal.        Thought Content: Thought content normal.        Judgment: Judgment normal.   NST FHR: 120 bpm, moderate variability, +15x15 accels, no decels Toco: Q3-4 mins  MAU Course  Procedures Dermoplast given for hemorrhoid pain IVF bolus  NST  Cervix changed from 1/50/-2 > 3/50/-2  MDM NST reactive and reassuring for gestational age, contracting q 2-4 mins.  Cervix changed from 1/50/-2 > 3/50/-2 Dr. 08-07-1997 was notified of patient status. Given that the NICU at [facility] is closed, it was determined that the best course of treatment would be to an outside facility BMZ #1 given. IVF's continued. Dr. [facility] to assume care and coordinate transfer to outside facility    Assessment and Plan  [redacted] weeks gestation of pregnancy Early labor   - Transfer to outside facility - Dr. Henderson Cloud to assume care of patient and will coordinate transfer   Henderson Cloud, CNM 08/18/2021, 7:12 AM

## 2021-08-18 NOTE — Lactation Note (Signed)
This note was copied from a baby's chart. Lactation Consultation Note  Patient Name: Jill Conley Today's Date: 08/18/2021 Reason for consult: Initial assessment;Mother's request;Difficult latch;Late-preterm 34-36.6wks;Infant < 6lbs;Breastfeeding assistance Age:30 hours  Dad served as Nurse, learning disability during visit.  NP, Laureen Rafeek doing assessment on infant during visit.  Dad left to get parents to visit with wife before visiting hrs ended at 2000.  LC informed Mom to call for latch assistance of RN or LC with next feeding.   Infant had recent feeding prior to [facility] arrival.  Mom assessed for flange size and set up on DEBP and she states comfortable fit.   LPTI guidelines reviewed to reduce calorie loss including keeping total feeding under 30 min.  Plan 1. To feed based on cues 8-12x 24hr period. Mom to offer breast first.  2. Mom to supplement with EBM first followed by formula 5-10 ml 3. DEBP q 3hrs for  4. I and O sheet reviewed.  All questions answered at the end of the visit.  Maternal Data Does the patient have breastfeeding experience prior to this delivery?: Yes  Feeding Mother's Current Feeding Choice: Breast Milk and Formula Nipple Type: Nfant Standard Flow (white)  LATCH Score                    Lactation Tools Discussed/Used    Interventions Interventions: Breast feeding basics reviewed;DEBP;Education;LC Services brochure;Visual merchandiser education  Discharge    Consult Status Consult Status: Follow-up Date: 08/19/21 Follow-up type: In-patient    Jill Tyrell  Conley 08/18/2021, 7:30 PM

## 2021-08-18 NOTE — Lactation Note (Addendum)
This note was copied from a baby's chart. Lactation Consultation Note  Patient Name: Jill Conley Today's Date: 08/18/2021   Age:30 hours LC called L and D RN Mom already latched baby and had a 20 minute feeding. LC to follow up on the floor.   Mom breast and formula on the floor. RN, Candace Cruise setting up DEBP with Mother. LC to follow.   Maternal Data    Feeding    LATCH Score                    Lactation Tools Discussed/Used    Interventions    Discharge    Consult Status      Jill Bonneville  Conley 08/18/2021, 5:43 PM

## 2021-08-18 NOTE — Anesthesia Postprocedure Evaluation (Signed)
Anesthesia Post Note  Patient: Scientist, research (medical)  Procedure(s) Performed: AN AD HOC LABOR EPIDURAL     Patient location during evaluation: Mother Baby Anesthesia Type: Epidural Level of consciousness: awake and alert Pain management: pain level controlled Vital Signs Assessment: post-procedure vital signs reviewed and stable Respiratory status: spontaneous breathing, nonlabored ventilation and respiratory function stable Cardiovascular status: stable Postop Assessment: no headache, no backache and epidural receding Anesthetic complications: no   No notable events documented.  Last Vitals:  Vitals:   08/18/21 1501 08/18/21 1531  BP: (!) 121/58 105/70  Pulse: 84 65  Resp:    Temp:    SpO2:      Last Pain:  Vitals:   08/18/21 1231  TempSrc: Oral  PainSc:    Pain Goal:                   Jill Conley

## 2021-08-18 NOTE — Anesthesia Preprocedure Evaluation (Signed)
Anesthesia Evaluation  Patient identified by MRN, date of birth, ID band Patient awake    Reviewed: Allergy & Precautions, H&P , NPO status , Patient's Chart, lab work & pertinent test results, reviewed documented beta blocker date and time   Airway Mallampati: II  TM Distance: >3 FB Neck ROM: full    Dental no notable dental hx. (+) Teeth Intact, Dental Advisory Given   Pulmonary neg pulmonary ROS,    Pulmonary exam normal breath sounds clear to auscultation       Cardiovascular negative cardio ROS Normal cardiovascular exam Rhythm:regular Rate:Normal     Neuro/Psych negative neurological ROS  negative psych ROS   GI/Hepatic negative GI ROS, Neg liver ROS,   Endo/Other  negative endocrine ROS  Renal/GU negative Renal ROS  negative genitourinary   Musculoskeletal   Abdominal   Peds  Hematology negative hematology ROS (+)   Anesthesia Other Findings   Reproductive/Obstetrics (+) Pregnancy                             Anesthesia Physical Anesthesia Plan  ASA: 2  Anesthesia Plan: Epidural   Post-op Pain Management:    Induction:   PONV Risk Score and Plan: 2  Airway Management Planned: Natural Airway  Additional Equipment: None  Intra-op Plan:   Post-operative Plan:   Informed Consent: I have reviewed the patients History and Physical, chart, labs and discussed the procedure including the risks, benefits and alternatives for the proposed anesthesia with the patient or authorized representative who has indicated his/her understanding and acceptance.     Dental Advisory Given  Plan Discussed with: Anesthesiologist  Anesthesia Plan Comments: (Labs checked- platelets confirmed with RN in room. Fetal heart tracing, per RN, reported to be stable enough for sitting procedure. Discussed epidural, and patient consents to the procedure:  included risk of possible headache,backache,  failed block, allergic reaction, and nerve injury. This patient was asked if she had any questions or concerns before the procedure started.)        Anesthesia Quick Evaluation  

## 2021-08-18 NOTE — Progress Notes (Signed)
Comfortable with epidural Afeb, VSS FHT-130s, Cat I, ctx q 2-3 min VE-6/70/-1, vtx, leaking clear fluid Continue ampicillin, monitor progress, anticipate SVD

## 2021-08-18 NOTE — H&P (Signed)
Jill Conley is a 30 y.o. female, G3 P1102, EGA 35+ weeks with EDC 1-22 presenting for ctx and hemorrhoids.  On eval in MAU, found to be having regular ctx, 5-6 cm dilated, was closed per Dr. Tenny Craw yesterday.  PNC complicated by h/o PTD, hemorrhoids, received early PNC in [place].  OB History     Gravida  3   Para  2   Term  1   Preterm  1   AB      Living  2      SAB      IAB      Ectopic      Multiple  0   Live Births  2          Past Medical History:  Diagnosis Date   Medical history non-contributory    Past Surgical History:  Procedure Laterality Date   NO PAST SURGERIES     WISDOM TOOTH EXTRACTION  2020   Family History: family history is not on file. Social History:  reports that she has never smoked. She does not have any smokeless tobacco history on file. She reports that she does not drink alcohol and does not use drugs.     Maternal Diabetes: No Genetic Screening: Declined Maternal Ultrasounds/Referrals: Normal Fetal Ultrasounds or other Referrals:  None Maternal Substance Abuse:  No Significant Maternal Medications:  None Significant Maternal Lab Results:  None Other Comments:  None  Review of Systems  Respiratory: Negative.    Cardiovascular: Negative.   Maternal Medical History:  Reason for admission: Contractions.   Contractions: Frequency: irregular.   Perceived severity is moderate.   Fetal activity: Perceived fetal activity is normal.   Prenatal complications: no prenatal complications Prenatal Complications - Diabetes: none.  Dilation: 5 Effacement (%): 50 Station: -2 Exam by:: H.Price, RN Blood pressure 101/61, pulse 65, temperature 98 F (36.7 C), temperature source Oral, resp. rate 18, height 5\' 3"  (1.6 m), weight 77.2 kg, last menstrual period 11/29/2020, SpO2 99 %. Maternal Exam:  Uterine Assessment: Contraction strength is moderate.  Contraction frequency is irregular.  Abdomen: Patient reports no abdominal tenderness.  Estimated fetal weight is 6 lbs.   Fetal presentation: vertex Introitus: Normal vulva. Normal vagina.  Amniotic fluid character: clear. Pelvis: adequate for delivery.     Fetal Exam Fetal Monitor Review: Mode: ultrasound.   Baseline rate: 130.  Variability: moderate (6-25 bpm).   Pattern: accelerations present and no decelerations.   Fetal State Assessment: Category I - tracings are normal.  Physical Exam Vitals reviewed.  Constitutional:      Appearance: Normal appearance.  Cardiovascular:     Rate and Rhythm: Normal rate and regular rhythm.  Pulmonary:     Effort: Pulmonary effort is normal. No respiratory distress.  Abdominal:     Palpations: Abdomen is soft.  Genitourinary:    General: Normal vulva.  Neurological:     Mental Status: She is alert.    Prenatal labs: ABO, Rh: --/--/A POS (12/23 09-23-1971) Antibody: NEG (12/23 0721) Rubella: Immune (06/21 0000) RPR: Nonreactive (06/21 0000)  HBsAg: Negative (06/21 0000)  HIV: Non-reactive (06/21 0000)  GBS:   none  Assessment/Plan: IUP at 35+ weeks in PTL.  She has been admitted, ampicillin for preterm and unknown GBS, has received epidural.  AROM done for augmentation, will monitor progress and anticipate SVD   11-22-1996 Jill Conley 08/18/2021, 11:48 AM

## 2021-08-18 NOTE — H&P (Signed)
Please refer to detailed CNM note below.  Briefly, this is a 31 y.o. [id number] [redacted]w[redacted]d who presented for hemorrhoids but is found to be in early labor.  NICU is unable to take any further patients and pt needs to be transferred to another facility for delivery.  No ROM. Good FM.  Pt has hx of prior 35 weeks delivery.     Prenatal Transfer Tool  Maternal Diabetes: No Genetic Screening: Normal Maternal Ultrasounds/Referrals: Normal Fetal Ultrasounds or other Referrals:  None Maternal Substance Abuse:  No Significant Maternal Medications:  None Significant Maternal Lab Results: None, no GBS done yet  Vitals:   08/18/21 0056 08/18/21 0208 08/18/21 0209 08/18/21 0238  BP:   95/64 103/63  Pulse:   63 (!) 56  Resp:   18 18  Temp:   98.1 F (36.7 C) 97.9 F (36.6 C)  TempSrc:   Oral Oral  SpO2:  98%  98%  Weight: 76.7 kg  77.2 kg   Height: 5\' 3"  (1.6 m)  5\' 3"  (1.6 m)     FHTs 120s, gSTV, NST R Toco - not recording SVE has gone from 1 to 3 cm  AP 35 5/7 weeks labor, unable to keep pt here.  Transferring.     Addendum: pt changed to 6 cm, transfer cancelled.  HPI Jill Conley is a 30 y.o. Jill Conley at 104w5d who presents to MAU for hemorrhoids, constipation, and contractions. Patient reports hemorrhoids and constipation have been an ongoing issues, but has gotten worse over the last 3 weeks. She reports that she does strain during BM's. Had a BM earlier today. She reports that she has tried miralax, colace, sitz baths, and hemorrhoid cream that was was prescribed without relief. She denies rectal bleeding, but endorses a lot of pain and itching. Reports that the only thing that helps relieve her constipation and hemorrhoid pain is when she takes castor oil. Patient also reports lower abdominal pain that started last week. Reports pain is intermittent and feels like contractions. The pain is not as bad as it was last week. Reports she was in the office today and cervix  was closed, however says that it was a difficult exam because she was "too tense". She denies vaginal bleeding or leaking fluid. Endorses active fetal movement.    Patient receives care at [facility].    OB History       Gravida  3   Para  2   Term  1   Preterm  1   AB      Living  2        SAB      IAB      Ectopic      Multiple  0   Live Births  2                   Past Medical History:  Diagnosis Date   Medical history non-contributory             Past Surgical History:  Procedure Laterality Date   NO PAST SURGERIES       WISDOM TOOTH EXTRACTION   2020           Family History  Problem Relation Age of Onset   Diabetes Neg Hx     Hypertension Neg Hx        Social History        Tobacco Use   Smoking status: Never  Vaping Use  Vaping Use: Never used  Substance Use Topics   Alcohol use: Never   Drug use: Never      Allergies: No Known Allergies          Medications Prior to Admission  Medication Sig Dispense Refill Last Dose   docusate sodium (COLACE) 100 MG capsule Take 200 mg by mouth daily.      08/17/2021   hydrocortisone 2.5 % ointment Apply 1 application topically 2 (two) times daily as needed (hemorrhoids).      08/17/2021   magnesium 30 MG tablet Take 30 mg by mouth 2 (two) times daily.     08/17/2021   polyethylene glycol powder (GLYCOLAX/MIRALAX) 17 GM/SCOOP powder Take 17 g by mouth every other day. Mix in 8 oz liquid and drink     08/17/2021   Prenatal Vit-Fe Fumarate-FA (MULTIVITAMIN-PRENATAL) 27-0.8 MG TABS tablet Take 1 tablet by mouth daily.      08/17/2021   witch hazel-glycerin (TUCKS) pad Apply 1 application topically 4 (four) times daily as needed for hemorrhoids.      08/17/2021   dibucaine (NUPERCAINAL) 1 % OINT Apply a small, pea-sized amount to painful external hemorrhoid, 3-4 times daily, as needed. 28 g 1     ofloxacin (FLOXIN) 0.3 % OTIC solution Place 5 drops into the right ear 2 (two) times daily. 5  mL 0        Review of Systems  Constitutional: Negative.   Respiratory: Negative.    Cardiovascular: Negative.   Gastrointestinal:  Positive for abdominal pain, constipation and rectal pain. Negative for anal bleeding and blood in stool.  Genitourinary: Negative.   Musculoskeletal: Negative.   Neurological: Negative.   Physical Exam    Blood pressure 103/63, pulse (!) 56, temperature 97.9 F (36.6 C), temperature source Oral, resp. rate 18, height 5\' 3"  (1.6 m), weight 77.2 kg, last menstrual period 11/29/2020, SpO2 98 %.   Physical Exam Vitals and nursing note reviewed.  Constitutional:      General: She is not in acute distress. Eyes:     Pupils: Pupils are equal, round, and reactive to light.  Cardiovascular:     Rate and Rhythm: Normal rate.  Pulmonary:     Effort: Pulmonary effort is normal.  Abdominal:     Palpations: Abdomen is soft.     Tenderness: There is no abdominal tenderness.     Comments: Gravid   Genitourinary:    Rectum: External hemorrhoid present.     Comments: Cervix: 1/50/-2, cephalic Musculoskeletal:        General: Normal range of motion.     Cervical back: Normal range of motion.  Skin:    General: Skin is warm and dry.  Neurological:     General: No focal deficit present.     Mental Status: She is alert and oriented to person, place, and time.  Psychiatric:        Mood and Affect: Mood normal.        Behavior: Behavior normal.        Thought Content: Thought content normal.        Judgment: Judgment normal.      MAU Course  Procedures   MDM    Assessment and Plan     08-07-1997 08/18/2021, 2:50 AM          Note Details  Author 08/20/2021, CNM File Time 08/18/2021  4:55 AM  Author Type Certified Nurse Midwife Status Incomplete  Last Editor 08/20/2021  Elbert Ewings, CNM Specialty Obstetrics and Gynecology  [facility] Acct # [id number] Admit Date 08/18/2021

## 2021-08-18 NOTE — MAU Note (Signed)
..  Jill Conley is a 30 y.o. at 106w5d here in MAU reporting: Hemorrhoid that is causing a lot of pain, it is bulging out, no bleeding. Contractions that began last week, wasn't dilated at [facility] today. Denies vaginal bleeding or leaking of fluid. +FM.  Last BM: today, took a spoon of castor oil   Pain score: 10/10 hemorrhoid. Ctx 10/10 Vitals:   08/18/21 0054  BP: 106/61  Pulse: 64  Resp: 18  Temp: 98.1 F (36.7 C)  SpO2: 97%     FHT:120

## 2021-08-18 NOTE — ED Provider Notes (Signed)
Emergency Medicine Provider OB Triage Evaluation Note  Jill Conley is a 30 y.o. female, [id number], at [redacted]w[redacted]d gestation who presents to the emergency department with complaints of worsening hemorrhoids and contractions.  Patient's husband reports that she was evaluated by her OB this morning and given hydrocortisone cream for her hemorrhoids.  He reports this is not helping and her hemorrhoids are worse.  Also reports she has been contracting intermittently on and off for the last week however they have become more intense and more regular tonight.  He reports increased vaginal discharge but no bloody show.  Review of  Systems  Positive: Abdominal cramping, hemorrhoid Negative: Nausea, vomiting, vaginal bleeding  Physical Exam  BP 106/61    Pulse 64    Temp 98.1 F (36.7 C) (Oral)    Resp 18    Ht 5\' 3"  (1.6 m)    Wt 76.7 kg    LMP 11/29/2020    SpO2 97%    BMI 29.94 kg/m   General: Awake, no distress  HEENT: Atraumatic  Resp: Normal effort  Cardiac: Normal rate Abd: nontender, gravid  MSK: Moves all extremities without difficulty Neuro: Speech clear  Medical Decision Making  Pt evaluated for pregnancy concern and is stable for transfer to MAU. Pt is in agreement with plan for transfer.  1:36 AM Discussed with MAU APP, Danielle, who accepts patient in transfer.  Clinical Impression   1. External hemorrhoid   2. Irregular contractions        Dawanda Mapel, 01/29/2021 08/18/21 0136    08/20/21, MD 08/18/21 [phone]

## 2021-08-18 NOTE — Progress Notes (Signed)
Comfortable with epidural Afeb, VSS FHT-130s, Cat I, ctx q 2-4 min VE-5/50/-2, vtx, AROM clear Continue ampicillin, monitor progress, anticipate SVD

## 2021-08-18 NOTE — ED Triage Notes (Signed)
Pt c/o hemorrhoid and constipation. Pt states she saw her OB this morning and they told that it was nothing that they could do. They prescribed her 2 ointment but they were unable to pick them up.

## 2021-08-18 NOTE — Anesthesia Procedure Notes (Signed)
Epidural Patient location during procedure: OB Start time: 08/18/2021 8:40 AM End time: 08/18/2021 9:45 AM  Staffing Anesthesiologist: Bethena Midget, MD  Preanesthetic Checklist Completed: patient identified, IV checked, site marked, risks and benefits discussed, surgical consent, monitors and equipment checked, pre-op evaluation and timeout performed  Epidural Patient position: sitting Prep: DuraPrep and site prepped and draped Patient monitoring: continuous pulse ox and blood pressure Approach: midline Location: L3-L4 Injection technique: LOR air  Needle:  Needle type: Tuohy  Needle gauge: 17 G Needle length: 9 cm and 9 Needle insertion depth: 6 cm Catheter type: closed end flexible Catheter size: 19 Gauge Catheter at skin depth: 12 cm Test dose: negative  Assessment Events: blood not aspirated, injection not painful, no injection resistance, no paresthesia and negative IV test

## 2021-08-18 NOTE — MAU Note (Signed)
Called [facility] dispatch to cancel transfer request.  Mauro Kaufmann [facility] triage [place] and informed of cancellation.

## 2021-08-19 LAB — CBC
HCT: 32.2 % — ABNORMAL LOW (ref 36.0–46.0)
Hemoglobin: 10.8 g/dL — ABNORMAL LOW (ref 12.0–15.0)
MCH: 31 pg (ref 26.0–34.0)
MCHC: 33.5 g/dL (ref 30.0–36.0)
MCV: 92.5 fL (ref 80.0–100.0)
Platelets: 250 10*3/uL (ref 150–400)
RBC: 3.48 MIL/uL — ABNORMAL LOW (ref 3.87–5.11)
RDW: 13.2 % (ref 11.5–15.5)
WBC: 13.5 10*3/uL — ABNORMAL HIGH (ref 4.0–10.5)
nRBC: 0 % (ref 0.0–0.2)

## 2021-08-19 MED ORDER — LACTATED RINGERS IV BOLUS
500.0000 mL | Freq: Once | INTRAVENOUS | Status: AC
  Administered 2021-08-19: 07:00:00 500 mL via INTRAVENOUS

## 2021-08-19 NOTE — Anesthesia Postprocedure Evaluation (Signed)
Anesthesia Post Note  Patient: Scientist, research (medical)  Procedure(s) Performed: AN AD HOC LABOR EPIDURAL     Patient location during evaluation: Mother Baby Anesthesia Type: Epidural Level of consciousness: awake, oriented and awake and alert Pain management: pain level controlled Vital Signs Assessment: post-procedure vital signs reviewed and stable Respiratory status: spontaneous breathing, respiratory function stable and nonlabored ventilation Cardiovascular status: stable Postop Assessment: no headache, adequate PO intake, able to ambulate, patient able to bend at knees and no apparent nausea or vomiting Anesthetic complications: no   No notable events documented.  Last Vitals:  Vitals:   08/19/21 0607 08/19/21 0700  BP: 96/61 100/71  Pulse: (!) 54 60  Resp:    Temp:    SpO2:      Last Pain:  Vitals:   08/18/21 2341  TempSrc: Oral  PainSc:    Pain Goal:                   Jill Conley

## 2021-08-19 NOTE — Lactation Note (Signed)
This note was copied from a baby's chart. Lactation Consultation Note  Patient Name: Jill Conley [id number] Date: 08/19/2021 Reason for consult: Follow-up assessment;Infant weight loss;Late-preterm 34-36.6wks (dad interpreted for mom ( [place] ) per [facility] due to not being able to obtain the correct dialect on the I pad.) Age:30 hours Per mom both nipples are sore. LC offered to assess breast tissue , mom receptive . LC noted the left areola to be compressible . Face portion of the nipple bruised, pinky red , no breakdown. The right areola compressible with some edema, cracking at the base of the upper portion of the nipple.  LC explored 2 different options with mom due the soreness:  Option #1 - LC to size the NS , using a dab of coconut oil on the left nipple / apply the NS, instill EBM or formula in the top and latch with firm support.  Supplement with EBM or formula pace feeding . And post pump both breast for 15 mins using coconut oil.  Option #2 - due to soreness - use a dab of coconut oil and pump both breast for 15 mins consistently around the feeding time with baby to bring the milk in.  Feed the baby from the Dr. Manson Passey Nipple - gradually working up towards  30 ml. Burp the baby prior to the feeding, middle of the feeding and after the feeding to decrease on spitting.   Mom choice Option #2 for tonight and tomorrow am will work on re-latching.   Maternal Data Has patient been taught Hand Expression?: Yes Does the patient have breastfeeding experience prior to this delivery?: Yes  Feeding Mother's Current Feeding Choice: Breast Milk and Formula Nipple Type: Dr. Lorne Skeens  LATCH Score                    Lactation Tools Discussed/Used Tools: Pump;Flanges (already set up and mom has been pumping) Flange Size: 27 (per dad per mom the #27 F is the most comfortable with the soreness.  see LC note) Breast pump type: Double-Electric Breast  Pump  Interventions Interventions: DEBP;Education  Discharge    Consult Status Consult Status: Follow-up Date: 08/20/21 Follow-up type: In-patient    Matilde Sprang Jasmin Trumbull 08/19/2021, 5:52 PM

## 2021-08-19 NOTE — Progress Notes (Signed)
Post Partum Day 1 Subjective: no complaints, up ad lib, voiding, and tolerating PO Pt had husband provide phone translation. Declined interpreter. Earlier this morning had increased abdominal pain , but has since resolved and is now feeling better. Denies headache or dizziness, nausea or vomiting  Objective: Blood pressure 107/71, pulse (!) 57, temperature 98 F (36.7 C), temperature source Oral, resp. rate 18, height 5\' 3"  (1.6 m), weight 77.2 kg, last menstrual period 11/29/2020, SpO2 98 %, unknown if currently breastfeeding.  Physical Exam:  General: alert, cooperative, and appears stated age Lochia: appropriate Uterine Fundus: firm DVT Evaluation: No evidence of DVT seen on physical exam.  Recent Labs    08/18/21 0721 08/19/21 0638  HGB 12.1 10.8*  HCT 36.0 32.2*    Assessment/Plan: Plan for discharge tomorrow Breast and bottle feeding   LOS: 1 day   08/21/21 08/19/2021, 12:06 PM

## 2021-08-19 NOTE — Progress Notes (Signed)
At 0550 walked in to pt room, was slow to arouse, pale, complained of dizziness, and weakness when walking to bathroom. Called Dr.meisinger new orders were place, care continued.  Marc Morgans RN

## 2021-08-19 NOTE — Anesthesia Postprocedure Evaluation (Deleted)
Anesthesia Post Note  Patient: Scientist, research (medical)  Procedure(s) Performed: AN AD HOC LABOR EPIDURAL     Patient location during evaluation: Mother Baby Anesthesia Type: Epidural Level of consciousness: awake, oriented and awake and alert Pain management: pain level controlled Vital Signs Assessment: post-procedure vital signs reviewed and stable Respiratory status: spontaneous breathing, respiratory function stable and nonlabored ventilation Cardiovascular status: stable Anesthetic complications: no   No notable events documented.  Last Vitals:  Vitals:   08/19/21 0607 08/19/21 0700  BP: 96/61 100/71  Pulse: (!) 54 60  Resp:    Temp:    SpO2:      Last Pain:  Vitals:   08/18/21 2341  TempSrc: Oral  PainSc:    Pain Goal:                   Imri Lor

## 2021-08-19 NOTE — Addendum Note (Signed)
Addendum  created 08/19/21 0830 by Zubair Lofton, Doree Fudge, CRNA   Charge Capture section accepted, Clinical Note Signed, Delete clinical note, Visit diagnoses modified

## 2021-08-20 ENCOUNTER — Ambulatory Visit: Payer: Self-pay

## 2021-08-20 MED ORDER — IBUPROFEN 600 MG PO TABS: 600.0000 mg | ORAL_TABLET | Freq: Four times a day (QID) | ORAL | 0 refills | Status: DC | PRN

## 2021-08-20 NOTE — Lactation Note (Signed)
This note was copied from a baby's chart. Lactation Consultation Note  Patient Name: Jill Conley DHWYS'H Date: 08/20/2021 Reason for consult: Follow-up assessment;Infant weight loss;Other (Comment) (6 % weight loss/) Age:30 hours Dad present to interpret. Per dad per mom the sore nipples are better and the baby has been back to the breast. The coconut oil and pumping has helped a lot.  LC reviewed importance until the milk comes in - breast massage, hand express, prepump with the hand pump and reverse pressure.  Importance when the milk comes in continue steps for latching and if to full need to hand express or prepump so the baby can get latched deeply.  LC reviewed BF D/C teaching -see below.   Maternal Data Has patient been taught Hand Expression?: Yes  Feeding Mother's Current Feeding Choice: Breast Milk and Formula Nipple Type: Slow - flow  LATCH Score                    Lactation Tools Discussed/Used Tools: Pump;Flanges Flange Size: 24;27 Breast pump type: Double-Electric Breast Pump;Manual Pump Education: Milk Storage Reason for Pumping: due to sore nipples - per mom improved  Interventions    Discharge Discharge Education: Engorgement and breast care;Warning signs for feeding baby WIC Program: Yes  Consult Status Consult Status: Complete Date: 08/20/21    Kathrin Greathouse 08/20/2021, 3:14 PM

## 2021-08-20 NOTE — Discharge Summary (Signed)
Postpartum Discharge Summary  Date of Service updated 08/20/2021     Patient Name: Jill Conley DOB: February 25, 1991 MRN: [id number]  Date of admission: 08/18/2021 Delivery date:08/18/2021  Delivering provider: Willis Modena, TODD  Date of discharge: 08/20/2021  Admitting diagnosis: External hemorrhoid [K64.4] Irregular contractions [O47.9] Preterm contractions [O47.00] Intrauterine pregnancy: [redacted]w[redacted]d    Secondary diagnosis:  Principal Problem:   Preterm contractions Active Problems:   SVD (spontaneous vaginal delivery)  Additional problems: Preterm delivery    Discharge diagnosis: Preterm Pregnancy Delivered                                              Post partum procedures: NA Augmentation: AROM Complications: None  Hospital course: Onset of Labor With Vaginal Delivery      30y.o. yo [id number] admitted in Latent Labor on 08/18/2021. Patient had an uncomplicated labor course as follows:  Membrane Rupture Time/Date: 11:37 AM ,08/18/2021   Delivery Method:Vaginal, Spontaneous  Episiotomy: None  Lacerations:  None  Patient had an uncomplicated postpartum course.  She is ambulating, tolerating a regular diet, passing flatus, and urinating well. Patient is discharged home in stable condition on 08/20/21.  Newborn Data: Birth date:08/18/2021  Birth time:4:18 PM  Gender:Female  Living status:Living  Apgars:8 ,9  Weight:2745 g   Magnesium Sulfate received: No BMZ received: Yes Rhophylac:No MMR:N/A   Physical exam  Vitals:   08/19/21 0911 08/19/21 1700 08/19/21 2140 08/20/21 0520  BP: 107/71 105/70 105/69 101/74  Pulse: (!) 57 (!) 59 63 (!) 50  Resp: 18  17 18   Temp:  97.9 F (36.6 C) 97.7 F (36.5 C) 97.8 F (36.6 C)  TempSrc:  Oral Oral Oral  SpO2: 98%  98% 97%  Weight:      Height:       General: alert, cooperative, and no distress Lochia: appropriate Uterine Fundus: firm Incision: Healing well with no significant drainage DVT Evaluation: No  evidence of DVT seen on physical exam. Labs: Lab Results  Component Value Date   WBC 13.5 (H) 08/19/2021   HGB 10.8 (L) 08/19/2021   HCT 32.2 (L) 08/19/2021   MCV 92.5 08/19/2021   PLT 250 08/19/2021   CMP Latest Ref Rng & Units 01/26/2021  Glucose 70 - 99 mg/dL 109(H)  BUN 6 - 20 mg/dL 10  Creatinine 0.44 - 1.00 mg/dL 0.59  Sodium 135 - 145 mmol/L 134(L)  Potassium 3.5 - 5.1 mmol/L 3.6  Chloride 98 - 111 mmol/L 104  CO2 22 - 32 mmol/L 23  Calcium 8.9 - 10.3 mg/dL 9.0   Edinburgh Score: Edinburgh Postnatal Depression Scale Screening Tool 08/18/2021  I have been able to laugh and see the funny side of things. (No Data)  I have looked forward with enjoyment to things. -  I have blamed myself unnecessarily when things went wrong. -  I have been anxious or worried for no good reason. -  I have felt scared or panicky for no good reason. -  Things have been getting on top of me. -  I have been so unhappy that I have had difficulty sleeping. -  I have felt sad or miserable. -  I have been so unhappy that I have been crying. -  The thought of harming myself has occurred to me. - Flavia Shipperostnatal Depression Scale Total -  After visit meds:  Allergies as of 08/20/2021   No Known Allergies      Medication List     STOP taking these medications    dibucaine 1 % Oint Commonly known as: NUPERCAINAL   docusate sodium 100 MG capsule Commonly known as: COLACE   hydrocortisone 2.5 % ointment   magnesium 30 MG tablet   multivitamin-prenatal 27-0.8 MG Tabs tablet   ofloxacin 0.3 % OTIC solution Commonly known as: FLOXIN   polyethylene glycol powder 17 GM/SCOOP powder Commonly known as: GLYCOLAX/MIRALAX   witch hazel-glycerin pad Commonly known as: TUCKS       TAKE these medications    ibuprofen 600 MG tablet Commonly known as: ADVIL Take 1 tablet (600 mg total) by mouth every 6 (six) hours as needed.               Discharge Care Instructions   (From admission, onward)           Start     Ordered   08/20/21 0000  Discharge wound care:       Comments: For a cesarean delivery: You may wash incision with soap and water.  Do not soak or submerge the incision for 2 weeks. Keep incision dry. You may need to keep a sanitary pad or panty liner between the incision and your clothing for comfort and to keep the incision dry. If you note drainage, increased pain, or increased redness of the incision, then please notify your physician.   08/20/21 1203   08/20/21 0000  If the dressing is still on your incision site when you go home, remove it on the third day after your surgery date. Remove dressing if it begins to fall off, or if it is dirty or damaged before the third day.       Comments: For a cesarean delivery   08/20/21 1203             Discharge home in stable condition Infant Feeding: Bottle and Breast Infant Disposition:home with mother Discharge instruction: per After Visit Summary and Postpartum booklet. Activity: Advance as tolerated. Pelvic rest for 6 weeks.  Diet: routine diet Anticipated Birth Control: Unsure Postpartum Appointment:4 weeks Additional Postpartum F/U:  Future Appointments:No future appointments. Follow up Visit:  Follow-up Information     Ob/Gyn, [facility] Follow up in 4 week(s).   Why: For a post-partum evaluation Contact information: [address] [place] [address] [phone]                     08/20/2021 Vanessa Kick, MD

## 2021-08-21 ENCOUNTER — Telehealth: Payer: Self-pay

## 2021-08-21 DIAGNOSIS — Z9189 Other specified personal risk factors, not elsewhere classified: Secondary | ICD-10-CM

## 2021-08-21 NOTE — Telephone Encounter (Signed)
Transition Care Management Unsuccessful Follow-up Telephone Call  Date of discharge and from where:  08/20/2021-[facility]  Attempts:  1st Attempt  Reason for unsuccessful TCM follow-up call:  Left voice message  No PCP, possible referral needed

## 2021-08-23 NOTE — Addendum Note (Signed)
Addended by: Victorio Palm on: 08/23/2021 04:51 PM   Modules accepted: Orders

## 2021-08-23 NOTE — Telephone Encounter (Signed)
Transition Care Management Follow-up Telephone Call Date of discharge and from where: 08/20/2021-[facility] How have you been since you were released from the hospital? Patient is doing fine.  Any questions or concerns? No  Items Reviewed: Did the pt receive and understand the discharge instructions provided? Yes  Medications obtained and verified? Yes  Other? No  Any new allergies since your discharge? No  Dietary orders reviewed? No Do you have support at home? Yes   Home Care and Equipment/Supplies: Were home health services ordered? not applicable If so, what is the name of the agency? N/A  Has the agency set up a time to come to the patient's home? not applicable Were any new equipment or medical supplies ordered?  No What is the name of the medical supply agency? N/A Were you able to get the supplies/equipment? not applicable Do you have any questions related to the use of the equipment or supplies? No  Functional Questionnaire: (I = Independent and D = Dependent) ADLs: I  Bathing/Dressing- I  Meal Prep- I  Eating- I  Maintaining continence- I  Transferring/Ambulation- I  Managing Meds- I  Follow up appointments reviewed:  PCP Hospital f/u appt confirmed? No   Specialist Hospital f/u appt confirmed? Yes  Scheduled to see OBGYN on 08/25/2021 @ 8:30AM. Are transportation arrangements needed? No  If their condition worsens, is the pt aware to call PCP or go to the Emergency Dept.? Yes Was the patient provided with contact information for the PCP's office or ED? Yes Was to pt encouraged to call back with questions or concerns? Yes

## 2021-08-27 DIAGNOSIS — Z419 Encounter for procedure for purposes other than remedying health state, unspecified: Secondary | ICD-10-CM | POA: Diagnosis not present

## 2021-08-30 ENCOUNTER — Encounter (HOSPITAL_COMMUNITY): Payer: Self-pay | Admitting: Obstetrics and Gynecology

## 2021-08-30 ENCOUNTER — Inpatient Hospital Stay (EMERGENCY_DEPARTMENT_HOSPITAL)
Admission: AD | Admit: 2021-08-30 | Discharge: 2021-08-31 | Disposition: A | Discharge status: Home / Self Care | Payer: Medicaid Other | Source: Home / Self Care | Admitting: Obstetrics and Gynecology

## 2021-08-30 DIAGNOSIS — N9089 Other specified noninflammatory disorders of vulva and perineum: Secondary | ICD-10-CM | POA: Diagnosis not present

## 2021-08-30 DIAGNOSIS — R1909 Other intra-abdominal and pelvic swelling, mass and lump: Secondary | ICD-10-CM | POA: Insufficient documentation

## 2021-08-30 DIAGNOSIS — O99891 Other specified diseases and conditions complicating pregnancy: Secondary | ICD-10-CM

## 2021-08-30 DIAGNOSIS — R102 Pelvic and perineal pain: Secondary | ICD-10-CM | POA: Insufficient documentation

## 2021-08-30 DIAGNOSIS — N750 Cyst of Bartholin's gland: Secondary | ICD-10-CM

## 2021-08-30 DIAGNOSIS — R300 Dysuria: Secondary | ICD-10-CM | POA: Insufficient documentation

## 2021-08-30 MED ORDER — LIDOCAINE HCL (PF) 1 % IJ SOLN
30.0000 mL | Freq: Once | INTRAMUSCULAR | Status: AC
  Administered 2021-08-30: 30 mL via INTRADERMAL
  Filled 2021-08-30: qty 30

## 2021-08-30 NOTE — MAU Provider Note (Signed)
Chief Complaint:  Groin Swelling   Event Date/Time   First Provider Initiated Contact with Patient 08/30/21 2209     Albanian interpretor used  HPI: Jill Conley is a 31 y.o. [phone] who presents to maternity admissions reporting swelling and pain in right labia.  Had an uncomplicated SVD 3 weeks ago with no lacerations.  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 .Has some dysuria. She denies vaginal itching/burning, h/a, dizziness, n/v, or fever/chills.    Vaginal Pain The patient's pertinent negatives include no genital itching, genital lesions, genital odor, pelvic pain, vaginal bleeding or vaginal discharge. This is a new problem. The current episode started in the past 7 days. The problem occurs constantly. The problem has been gradually worsening. The problem affects the right side. She is not pregnant. Pertinent negatives include no abdominal pain, back pain, chills or fever. The symptoms are aggravated by tactile pressure (walking). She has tried nothing for the symptoms.  RN note: Jill Conley is a 31 y.o. at [facility] here in MAU reporting: she has vaginal swelling on the right side delivered on the 23rd.   Pain score: 10/10 when walking    Past Medical History: Past Medical History:  Diagnosis Date   Medical history non-contributory     Past obstetric history: OB History  Gravida Para Term Preterm AB Living  3 3 1 2   3   SAB IAB Ectopic Multiple Live Births        0 3    # Outcome Date GA Lbr Len/2nd Weight Sex Delivery Anes PTL Lv  3 Preterm 08/18/21 [redacted]w[redacted]d 08:30 / 00:04 2745 g F Vag-Spont EPI  LIV  2 Preterm 05/15/19 [redacted]w[redacted]d 06:04 / 00:24 2461 g M Vag-Spont EPI  LIV  1 Term             Past Surgical History: Past Surgical History:  Procedure Laterality Date   NO PAST SURGERIES     WISDOM TOOTH EXTRACTION  2020    Family History: Family History  Problem Relation Age of Onset   Diabetes Neg Hx    Hypertension Neg Hx     Social History: Social  History   Tobacco Use   Smoking status: Never  Vaping Use   Vaping Use: Never used  Substance Use Topics   Alcohol use: Never   Drug use: Never    Allergies: No Known Allergies  Meds:  Medications Prior to Admission  Medication Sig Dispense Refill Last Dose   ibuprofen (ADVIL) 600 MG tablet Take 1 tablet (600 mg total) by mouth every 6 (six) hours as needed. 90 tablet 0     I have reviewed patient's Past Medical Hx, Surgical Hx, Family Hx, Social Hx, medications and allergies.  ROS:  Review of Systems  Constitutional:  Negative for chills and fever.  Gastrointestinal:  Negative for abdominal pain.  Genitourinary:  Positive for vaginal pain. Negative for pelvic pain and vaginal discharge.  Musculoskeletal:  Negative for back pain.  Other systems negative     Physical Exam  Patient Vitals for the past 24 hrs:  BP Temp Temp src Pulse Resp SpO2  08/30/21 2154 124/76 98.6 F (37 C) Oral 74 18 99 %  08/30/21 2043 107/74 98.6 F (37 C) Oral 72 20 99 %   Constitutional: Well-developed, well-nourished female in no acute distress.  Cardiovascular: normal rate and rhythm Respiratory: normal effort, no distress.  GI: Abd soft, non-tender.  Nondistended.  No rebound, No guarding.   MS:  Extremities nontender, no edema, normal ROM Neurologic: Alert and oriented x 4.   Grossly nonfocal. GU: Neg CVAT. Skin:  Warm and Dry Psych:  Affect appropriate.  PELVIC EXAM:  Right Bartholins cyst on right labia Fluctuant but small area of induration internally, no erethema Dr Donavan Foil looked at it also and agrees with diagnosis, plan to drain.   Labs:  --/--/A POS (12/23 1443)  Imaging:  No results found.  MAU Course/MDM: I have ordered labs as follows: UA Imaging ordered: None Results reviewed.   Consult Dr Henderson Cloud who agrees with plan to drain it and give antibiotics.   Treatments in MAU included Incision and Drainage of Bartholins cyst.  Bartholin Cyst I&D and Ward Catheter  Placement Enlarged abscess palpated in front of the hymenal ring around 7 o' clock.  Written informed consent was obtained.  Discussed complications and possible outcomes of procedure including recurrence of cyst, scarring leading to infecton, bleeding, dyspareunia, distortion of anatomy.  Patient was examined in the dorsal lithotomy position and mass was identified.  The area was prepped with Iodine and draped in a sterile manner. 1% Lidocaine (3 ml) was then used to infiltrate area on top of the cyst, behind the hymenal ring.  A 2 mm stab incision was made using a sterile scapel. Upon palpation of the mass, a moderate amount of clear watery drainage was expressed through the incision. A hemostat was used to break up loculations     Word catheter was not placed.  Patient tolerated the procedure well, reported feeling " a lot better." - Bactrim DS bid x 7 days for treatment Rx Oxy IR for prn use for pain - Recommended Sitz baths bid and Motrin prn pain.   She was told to call to be examined if she experiences increasing swelling, pain, vaginal discharge, or fever.  - She was instructed to wear a peripad to absorb discharge, and to maintain pelvic rest while the Word catheter is in place.   - She will need an appointment in [facility] Clinic[facility] week .   Pt stable at time of discharge.  Assessment: Postpartum Day #12 Right Bartholins Cyst, drained per recommendation of Dr Donavan Foil and Dr Henderson Cloud No purulent drainage from cyst, clear fluid drained  Plan: Discharge home Recommend sitz baths, observe for worseniing or erethema Rx sent for Septra for post procedure prophylaxis (OK per [facility]) Rx Oxy IR for prn use for pain Recommend schedule followup in office next week Encouraged to return here or to other Urgent Care/ED if she develops worsening of symptoms, increase in pain, fever, or other concerning symptoms.   Wynelle Bourgeois CNM, MSN Certified Nurse-Midwife 08/30/2021 10:09 PM

## 2021-08-30 NOTE — ED Triage Notes (Signed)
Pt presents to the ED from home with complaints of vaginal birth on 08/22/21. Increased vaginal pain and swelling onset 2 days. Pt having swelling on right labia.

## 2021-08-30 NOTE — MAU Note (Signed)
..  Jill Conley is a 31 y.o. at [facility] here in MAU reporting: she has vaginal swelling on the right side delivered on the 23rd.   Pain score: 10/10 when walking  Vitals:   08/30/21 2043 08/30/21 2154  BP: 107/74 124/76  Pulse: 72 74  Resp: 20 18  Temp: 98.6 F (37 C) 98.6 F (37 C)  SpO2: 99% 99%

## 2021-08-30 NOTE — ED Provider Notes (Signed)
Emergency Medicine Provider OB Triage Evaluation Note  Jill Conley is a 31 y.o. female, [phone], who delivered on 12/23 who presents to the emergency department with complaints of right-sided vaginal edema and pain. No fever or chills. Notes this pain is different than her hemorrhoids.   Review of  Systems  Positive: vaginal pain Negative: fever  Physical Exam  BP 107/74 (BP Location: Right Arm)    Pulse 72    Temp 98.6 F (37 C) (Oral)    Resp 20    LMP 11/29/2020    SpO2 99%  General: Awake, no distress  HEENT: Atraumatic  Resp: Normal effort  Cardiac: Normal rate Abd: Nondistended, nontender  MSK: Moves all extremities without difficulty Neuro: Speech clear  Medical Decision Making  Pt evaluated for pregnancy concern and is stable for transfer to MAU. Pt is in agreement with plan for transfer.  9:17 PM Discussed with MAU APP, Anderson Malta who accepts patient in transfer.  Clinical Impression  No diagnosis found.     Karie Kirks 08/30/21 2118    Margette Fast, MD 09/03/21 [phone]

## 2021-08-31 ENCOUNTER — Ambulatory Visit (HOSPITAL_COMMUNITY): Payer: Medicaid Other | Admitting: Anesthesiology

## 2021-08-31 ENCOUNTER — Ambulatory Visit (HOSPITAL_COMMUNITY)
Admission: RE | Admit: 2021-08-31 | Discharge: 2021-08-31 | Disposition: A | Discharge status: Home / Self Care | Payer: Medicaid Other | Source: Ambulatory Visit | Attending: Obstetrics and Gynecology | Admitting: Obstetrics and Gynecology

## 2021-08-31 ENCOUNTER — Encounter (HOSPITAL_COMMUNITY)
Admission: RE | Disposition: A | Discharge status: Home / Self Care | Payer: Self-pay | Source: Ambulatory Visit | Attending: Obstetrics and Gynecology

## 2021-08-31 ENCOUNTER — Encounter (HOSPITAL_COMMUNITY): Payer: Self-pay | Admitting: Obstetrics and Gynecology

## 2021-08-31 ENCOUNTER — Telehealth (HOSPITAL_COMMUNITY): Payer: Self-pay | Admitting: *Deleted

## 2021-08-31 ENCOUNTER — Other Ambulatory Visit: Payer: Self-pay

## 2021-08-31 DIAGNOSIS — N750 Cyst of Bartholin's gland: Secondary | ICD-10-CM | POA: Insufficient documentation

## 2021-08-31 DIAGNOSIS — N9089 Other specified noninflammatory disorders of vulva and perineum: Secondary | ICD-10-CM | POA: Diagnosis not present

## 2021-08-31 HISTORY — PX: BARTHOLIN CYST MARSUPIALIZATION: SHX5383

## 2021-08-31 SURGERY — MARSUPIALIZATION, CYST, BARTHOLIN'S GLAND
Anesthesia: General

## 2021-08-31 MED ORDER — ORAL CARE MOUTH RINSE: 15.0000 mL | Freq: Once | OROMUCOSAL | Status: AC

## 2021-08-31 MED ORDER — PROPOFOL 10 MG/ML IV BOLUS
INTRAVENOUS | Status: DC | PRN
  Administered 2021-08-31: 200 mg via INTRAVENOUS

## 2021-08-31 MED ORDER — ROCURONIUM BROMIDE 10 MG/ML (PF) SYRINGE
PREFILLED_SYRINGE | INTRAVENOUS | Status: DC | PRN
Start: 2021-08-31 — End: 2021-08-31
  Administered 2021-08-31: 30 mg via INTRAVENOUS

## 2021-08-31 MED ORDER — OXYCODONE HCL 5 MG PO TABS: 5.0000 mg | ORAL_TABLET | Freq: Four times a day (QID) | ORAL | 0 refills | Status: DC | PRN

## 2021-08-31 MED ORDER — KETOROLAC TROMETHAMINE 30 MG/ML IJ SOLN
INTRAMUSCULAR | Status: AC
  Filled 2021-08-31: qty 1

## 2021-08-31 MED ORDER — ROCURONIUM BROMIDE 10 MG/ML (PF) SYRINGE
PREFILLED_SYRINGE | INTRAVENOUS | Status: AC
  Filled 2021-08-31: qty 10

## 2021-08-31 MED ORDER — GLYCOPYRROLATE 0.2 MG/ML IJ SOLN
INTRAMUSCULAR | Status: DC | PRN
  Administered 2021-08-31: .2 mg via INTRAVENOUS

## 2021-08-31 MED ORDER — FENTANYL CITRATE (PF) 100 MCG/2ML IJ SOLN: 25.0000 ug | INTRAMUSCULAR | Status: DC | PRN

## 2021-08-31 MED ORDER — SUCCINYLCHOLINE CHLORIDE 200 MG/10ML IV SOSY
PREFILLED_SYRINGE | INTRAVENOUS | Status: DC | PRN
  Administered 2021-08-31: 120 mg via INTRAVENOUS

## 2021-08-31 MED ORDER — CHLORHEXIDINE GLUCONATE 0.12 % MT SOLN: 15.0000 mL | Freq: Once | OROMUCOSAL | Status: AC

## 2021-08-31 MED ORDER — PROPOFOL 10 MG/ML IV BOLUS
INTRAVENOUS | Status: AC
  Filled 2021-08-31: qty 20

## 2021-08-31 MED ORDER — DEXAMETHASONE SODIUM PHOSPHATE 10 MG/ML IJ SOLN
INTRAMUSCULAR | Status: AC
  Filled 2021-08-31: qty 1

## 2021-08-31 MED ORDER — SUGAMMADEX SODIUM 200 MG/2ML IV SOLN
INTRAVENOUS | Status: DC | PRN
  Administered 2021-08-31: 200 mg via INTRAVENOUS

## 2021-08-31 MED ORDER — LACTATED RINGERS IV SOLN: INTRAVENOUS | Status: DC

## 2021-08-31 MED ORDER — ACETAMINOPHEN 10 MG/ML IV SOLN
INTRAVENOUS | Status: DC | PRN
  Administered 2021-08-31: 1000 mg via INTRAVENOUS

## 2021-08-31 MED ORDER — AMISULPRIDE (ANTIEMETIC) 5 MG/2ML IV SOLN: 10.0000 mg | Freq: Once | INTRAVENOUS | Status: DC | PRN

## 2021-08-31 MED ORDER — PROMETHAZINE HCL 25 MG/ML IJ SOLN: 6.2500 mg | INTRAMUSCULAR | Status: DC | PRN

## 2021-08-31 MED ORDER — DEXAMETHASONE SODIUM PHOSPHATE 10 MG/ML IJ SOLN
INTRAMUSCULAR | Status: DC | PRN
  Administered 2021-08-31: 10 mg via INTRAVENOUS

## 2021-08-31 MED ORDER — LIDOCAINE 2% (20 MG/ML) 5 ML SYRINGE
INTRAMUSCULAR | Status: AC
  Filled 2021-08-31: qty 5

## 2021-08-31 MED ORDER — MIDAZOLAM HCL 2 MG/2ML IJ SOLN
INTRAMUSCULAR | Status: AC
  Filled 2021-08-31: qty 2

## 2021-08-31 MED ORDER — LIDOCAINE-EPINEPHRINE 1 %-1:100000 IJ SOLN
INTRAMUSCULAR | Status: AC
  Filled 2021-08-31: qty 1

## 2021-08-31 MED ORDER — MIDAZOLAM HCL 2 MG/2ML IJ SOLN
INTRAMUSCULAR | Status: DC | PRN
  Administered 2021-08-31: 2 mg via INTRAVENOUS

## 2021-08-31 MED ORDER — OXYCODONE HCL 5 MG/5ML PO SOLN: 5.0000 mg | Freq: Once | ORAL | Status: DC | PRN

## 2021-08-31 MED ORDER — CHLORHEXIDINE GLUCONATE 0.12 % MT SOLN
OROMUCOSAL | Status: AC
  Administered 2021-08-31: 15 mL via OROMUCOSAL
  Filled 2021-08-31: qty 15

## 2021-08-31 MED ORDER — 0.9 % SODIUM CHLORIDE (POUR BTL) OPTIME
TOPICAL | Status: DC | PRN
  Administered 2021-08-31: 1000 mL

## 2021-08-31 MED ORDER — ONDANSETRON HCL 4 MG/2ML IJ SOLN
INTRAMUSCULAR | Status: DC | PRN
  Administered 2021-08-31: 4 mg via INTRAVENOUS

## 2021-08-31 MED ORDER — ONDANSETRON HCL 4 MG/2ML IJ SOLN
INTRAMUSCULAR | Status: AC
  Filled 2021-08-31: qty 2

## 2021-08-31 MED ORDER — SULFAMETHOXAZOLE-TRIMETHOPRIM 800-160 MG PO TABS: 1.0000 | ORAL_TABLET | Freq: Two times a day (BID) | ORAL | 0 refills | Status: AC

## 2021-08-31 MED ORDER — OXYCODONE HCL 5 MG PO TABS: 5.0000 mg | ORAL_TABLET | Freq: Once | ORAL | Status: DC | PRN

## 2021-08-31 MED ORDER — FENTANYL CITRATE (PF) 250 MCG/5ML IJ SOLN
INTRAMUSCULAR | Status: AC
  Filled 2021-08-31: qty 5

## 2021-08-31 MED ORDER — LIDOCAINE 2% (20 MG/ML) 5 ML SYRINGE
INTRAMUSCULAR | Status: DC | PRN
Start: 2021-08-31 — End: 2021-08-31
  Administered 2021-08-31: 60 mg via INTRAVENOUS

## 2021-08-31 MED ORDER — FENTANYL CITRATE (PF) 250 MCG/5ML IJ SOLN
INTRAMUSCULAR | Status: DC | PRN
Start: 2021-08-31 — End: 2021-08-31
  Administered 2021-08-31: 100 ug via INTRAVENOUS
  Administered 2021-08-31: 50 ug via INTRAVENOUS

## 2021-08-31 MED ORDER — LIDOCAINE-EPINEPHRINE 1 %-1:100000 IJ SOLN
INTRAMUSCULAR | Status: DC | PRN
  Administered 2021-08-31: 8 mL

## 2021-08-31 MED ORDER — KETOROLAC TROMETHAMINE 30 MG/ML IJ SOLN
INTRAMUSCULAR | Status: DC | PRN
Start: 2021-08-31 — End: 2021-08-31
  Administered 2021-08-31: 30 mg via INTRAVENOUS

## 2021-08-31 SURGICAL SUPPLY — 26 items
AGENT HMST KT MTR STRL THRMB (HEMOSTASIS) ×1
BLADE SURG 15 STRL LF DISP TIS (BLADE) ×1 IMPLANT
BLADE SURG 15 STRL SS (BLADE) ×2
CATH BARTHOLIN GLAND 10FR 5CM (CATHETERS) ×1 IMPLANT
CATH ROBINSON RED A/P 16FR (CATHETERS) ×2 IMPLANT
DRAPE SHEET LG 3/4 BI-LAMINATE (DRAPES) ×4 IMPLANT
ELECT REM PT RETURN 9FT ADLT (ELECTROSURGICAL)
ELECTRODE REM PT RTRN 9FT ADLT (ELECTROSURGICAL) IMPLANT
GLOVE SURG ENC MOIS LTX SZ7 (GLOVE) ×2 IMPLANT
GLOVE SURG UNDER POLY LF SZ7 (GLOVE) ×2 IMPLANT
GOWN STRL REUS W/ TWL LRG LVL3 (GOWN DISPOSABLE) ×2 IMPLANT
GOWN STRL REUS W/TWL LRG LVL3 (GOWN DISPOSABLE) ×4
NEEDLE HYPO 22GX1.5 SAFETY (NEEDLE) ×2 IMPLANT
NS IRRIG 1000ML POUR BTL (IV SOLUTION) ×2 IMPLANT
PACK VAGINAL MINOR WOMEN LF (CUSTOM PROCEDURE TRAY) ×2 IMPLANT
PAD OB MATERNITY 4.3X12.25 (PERSONAL CARE ITEMS) ×2 IMPLANT
PENCIL BUTTON HOLSTER BLD 10FT (ELECTRODE) ×2 IMPLANT
SURGIFLO W/THROMBIN 8M KIT (HEMOSTASIS) ×1 IMPLANT
SUT VIC AB 3-0 SH 27 (SUTURE) ×2
SUT VIC AB 3-0 SH 27X BRD (SUTURE) ×1 IMPLANT
SWAB COLLECTION DEVICE MRSA (MISCELLANEOUS) IMPLANT
SWAB CULTURE ESWAB REG 1ML (MISCELLANEOUS) IMPLANT
SYR CONTROL 10ML LL (SYRINGE) ×2 IMPLANT
TOWEL GREEN STERILE FF (TOWEL DISPOSABLE) ×4 IMPLANT
TUBE CONNECTING 12X1/4 (SUCTIONS) IMPLANT
YANKAUER SUCT BULB TIP NO VENT (SUCTIONS) IMPLANT

## 2021-08-31 NOTE — Anesthesia Postprocedure Evaluation (Signed)
Anesthesia Post Note  Patient: Conservator, museum/gallery  Procedure(s) Performed: Incision and Drainage of Bartholins cyst     Patient location during evaluation: PACU Anesthesia Type: General Level of consciousness: awake and alert, oriented and patient cooperative Pain management: pain level controlled Vital Signs Assessment: post-procedure vital signs reviewed and stable Respiratory status: spontaneous breathing, nonlabored ventilation and respiratory function stable Cardiovascular status: blood pressure returned to baseline and stable Postop Assessment: no apparent nausea or vomiting Anesthetic complications: no   No notable events documented.  Last Vitals:  Vitals:   08/31/21 2030 08/31/21 2045  BP: 114/66 105/66  Pulse: 67 67  Resp: 14 15  Temp:  36.7 C  SpO2: 93% 95%    Last Pain:  Vitals:   08/31/21 2045  PainSc: 0-No pain                 Pervis Hocking

## 2021-08-31 NOTE — Anesthesia Procedure Notes (Signed)
Procedure Name: Intubation Date/Time: 08/31/2021 7:37 PM Performed by: Renford Dills, CRNA Pre-anesthesia Checklist: Patient identified, Patient being monitored, Timeout performed, Emergency Drugs available and Suction available Patient Re-evaluated:Patient Re-evaluated prior to induction Oxygen Delivery Method: Circle System Utilized Preoxygenation: Pre-oxygenation with 100% oxygen Induction Type: IV induction and Rapid sequence Laryngoscope Size: Miller and 2 Grade View: Grade I Tube type: Oral Tube size: 7.0 mm Number of attempts: 1 Airway Equipment and Method: stylet Placement Confirmation: ETT inserted through vocal cords under direct vision, positive ETCO2 and breath sounds checked- equal and bilateral Secured at: 21 cm Tube secured with: Tape Dental Injury: Teeth and Oropharynx as per pre-operative assessment

## 2021-08-31 NOTE — Op Note (Signed)
08/31/2021  7:58 PM  PATIENT:  Jill Conley  31 y.o. female  PRE-OPERATIVE DIAGNOSIS:  infected labia  POST-OPERATIVE DIAGNOSIS:  BARTHOLIN GLAND CYST hematoma and bleeding  PROCEDURE:  Procedure(s): Incision and Drainage of Bartholins cyst (N/A)  SURGEON:  Surgeon(s) and Role:    Carrington Clamp, MD - Primary  ANESTHESIA:   general  EBL:  10 mL   DRAINS: word catheter   LOCAL MEDICATIONS USED:  LIDOCAINE, surgiflo   SPECIMEN:  No Specimen  DISPOSITION OF SPECIMEN:  PATHOLOGY  COUNTS:  YES  TOURNIQUET:  * No tourniquets in log *  DICTATION: .Note written in EPIC  PLAN OF CARE: Discharge to home after PACU  PATIENT DISPOSITION:  PACU - hemodynamically stable.   Delay start of Pharmacological VTE agent (>24hrs) due to surgical blood loss or risk of bleeding: not applicable  Findings: 4 x3 cm hematoma of R BGD cyst extending into vagina.  Comp: none  Technique:  After adequate anethesia was achieved, pt was prepped and draped in usual sterile fashion.  Bladder was emptied and EUA was done.  A scaplel was used to open the same incision from last night and extended slightly.  About 30 cc of old clot was expressed from area.  No pus seen.  After entire cyst and vaginal extension was emptied and a rectal exam revealed no other clot, observation revealed fresh bright red blood coming from the incision.  It was likely that the vessel causing the hematoma was still bleeding and the decision was made to coagulate it with surgiflo, thus avoiding extensive surgery in the area.  Surgiflo was placed in the cyst as deep as I could and we observed for several minutes after several minutes of holding pressure.  No further bleeding was seen.  The word cath was placed and inflated to 5 cc sterile saline.  Again we observed for several minutes and no further bleeding or dilation was seen.  Lidocain with epi was injected into the perineum and the skin next to the cyst.   Pt tolerated the  procedure well and was sent to recovery in stable condition.

## 2021-08-31 NOTE — Telephone Encounter (Signed)
Her partner answered the phone. Reports she's resting and taking pain med from cyst issue last evening. No concerns about herself at this time.  Baby is doing well per FOB. Gaining weight, eating well, peeing and pooping without difficulty. No concerns about infant at this time.  Duffy Rhody, RN 08-31-2021 at 9:48am

## 2021-08-31 NOTE — H&P (Signed)
31 y.o. [id number] complains of recurrent BGD cyst from last night.  Pt pp from 12-23, had cyst come up over last couple days and went to MAU last night.  CMN opened and drained but did not place word.  Later today had recurrence of cyst and is very painful.  I was planning to again I&D in office but pt was extremely afraid of the pain.  She desires I&D under anesthesia.  Past Medical History:  Diagnosis Date   Medical history non-contributory    Past Surgical History:  Procedure Laterality Date   NO PAST SURGERIES     WISDOM TOOTH EXTRACTION  2020    Social History   Socioeconomic History   Marital status: Married    Spouse name: Not on file   Number of children: Not on file   Years of education: Not on file   Highest education level: Not on file  Occupational History   Not on file  Tobacco Use   Smoking status: Never   Smokeless tobacco: Not on file  Vaping Use   Vaping Use: Never used  Substance and Sexual Activity   Alcohol use: Never   Drug use: Never   Sexual activity: Yes    Birth control/protection: None  Other Topics Concern   Not on file  Social History Narrative   Not on file   Social Determinants of Health   Financial Resource Strain: Not on file  Food Insecurity: Not on file  Transportation Needs: Not on file  Physical Activity: Not on file  Stress: Not on file  Social Connections: Not on file  Intimate Partner Violence: Not on file    No current facility-administered medications on file prior to encounter.   Current Outpatient Medications on File Prior to Encounter  Medication Sig Dispense Refill   ibuprofen (ADVIL) 600 MG tablet Take 1 tablet (600 mg total) by mouth every 6 (six) hours as needed. 90 tablet 0   oxyCODONE (OXY IR/ROXICODONE) 5 MG immediate release tablet Take 1 tablet (5 mg total) by mouth every 6 (six) hours as needed for severe pain. 10 tablet 0   sulfamethoxazole-trimethoprim (BACTRIM DS) 800-160 MG tablet Take 1 tablet by mouth 2  (two) times daily for 5 days. 10 tablet 0    No Known Allergies  There were no vitals filed for this visit.  Lungs: clear to ascultation Cor:  RRR Abdomen:  soft, nontender, nondistended. Ex:  no cords, erythema Pelvic:  3 cm bulging BGD on R, bruised and seems to extend into vagina.  Area of I&D clotted over.    A:  For I&D and word cath placement under anesthesia.   P: P: All risks, benefits and alternatives d/w patient and she desires to proceed.   Loney Laurence

## 2021-08-31 NOTE — Anesthesia Preprocedure Evaluation (Addendum)
Anesthesia Evaluation  Patient identified by MRN, date of birth, ID band Patient awake    Reviewed: Allergy & Precautions, NPO status , Patient's Chart, lab work & pertinent test results  Airway Mallampati: II  TM Distance: >3 FB Neck ROM: Full    Dental  (+) Teeth Intact, Dental Advisory Given   Pulmonary neg pulmonary ROS,    breath sounds clear to auscultation       Cardiovascular negative cardio ROS   Rhythm:Regular Rate:Normal     Neuro/Psych negative neurological ROS  negative psych ROS   GI/Hepatic negative GI ROS, Neg liver ROS,   Endo/Other  negative endocrine ROS  Renal/GU negative Renal ROS     Musculoskeletal   Abdominal Normal abdominal exam  (+)   Peds  Hematology negative hematology ROS (+)   Anesthesia Other Findings   Reproductive/Obstetrics                            Anesthesia Physical Anesthesia Plan  ASA: 1  Anesthesia Plan: General   Post-op Pain Management:    Induction: Intravenous, Cricoid pressure planned and Rapid sequence  PONV Risk Score and Plan: 4 or greater and Ondansetron, Dexamethasone, Midazolam and Scopolamine patch - Pre-op  Airway Management Planned: Oral ETT  Additional Equipment: None  Intra-op Plan:   Post-operative Plan: Extubation in OR  Informed Consent: I have reviewed the patients History and Physical, chart, labs and discussed the procedure including the risks, benefits and alternatives for the proposed anesthesia with the patient or authorized representative who has indicated his/her understanding and acceptance.     Dental advisory given  Plan Discussed with: CRNA  Anesthesia Plan Comments:        Anesthesia Quick Evaluation

## 2021-08-31 NOTE — Brief Op Note (Signed)
08/31/2021  7:58 PM  PATIENT:  Jill Conley  31 y.o. female  PRE-OPERATIVE DIAGNOSIS:  infected labia  POST-OPERATIVE DIAGNOSIS:  BARTHOLIN GLAND CYST hematoma and bleeding  PROCEDURE:  Procedure(s): Incision and Drainage of Bartholins cyst (N/A)  SURGEON:  Surgeon(s) and Role:    Carrington Clamp, MD - Primary  ANESTHESIA:   general  EBL:  10 mL   DRAINS:  word catheter    LOCAL MEDICATIONS USED:  LIDOCAINE, surgiflo   SPECIMEN:  No Specimen  DISPOSITION OF SPECIMEN:  PATHOLOGY  COUNTS:  YES  TOURNIQUET:  * No tourniquets in log *  DICTATION: .Note written in EPIC  PLAN OF CARE: Discharge to home after PACU  PATIENT DISPOSITION:  PACU - hemodynamically stable.   Delay start of Pharmacological VTE agent (>24hrs) due to surgical blood loss or risk of bleeding: not applicable

## 2021-08-31 NOTE — Transfer of Care (Signed)
Immediate Anesthesia Transfer of Care Note  Patient: Jill Conley  Procedure(s) Performed: Incision and Drainage of Bartholins cyst  Patient Location: PACU  Anesthesia Type:General  Level of Consciousness: drowsy  Airway & Oxygen Therapy: Patient Spontanous Breathing and Patient connected to nasal cannula oxygen  Post-op Assessment: Report given to RN and Post -op Vital signs reviewed and stable  Post vital signs: Reviewed and stable  Last Vitals:  Vitals Value Taken Time  BP 111/60 08/31/21 2012  Temp    Pulse 72 08/31/21 2013  Resp 17 08/31/21 2013  SpO2 96 % 08/31/21 2013  Vitals shown include unvalidated device data.  Last Pain:  Vitals:   08/31/21 1820  PainSc: 0-No pain         Complications: No notable events documented.

## 2021-08-31 NOTE — MAU Note (Signed)
Consent form for procedure signed by patient, provider, and RN. Placed in medical records bin.

## 2021-08-31 NOTE — Progress Notes (Signed)
There has been no change in the patients history, status or exam since the history and physical.  Vitals:   08/31/21 1820  BP: (!) 145/74  Pulse: (!) 59  Resp: 17  Temp: 98.6 F (37 C)  SpO2: 99%    No results found for this or any previous visit (from the past 72 hour(s)).  Jill Conley

## 2021-09-01 ENCOUNTER — Encounter (HOSPITAL_COMMUNITY): Payer: Self-pay | Admitting: Obstetrics and Gynecology

## 2021-09-04 ENCOUNTER — Inpatient Hospital Stay (HOSPITAL_COMMUNITY)
Admission: AD | Admit: 2021-09-04 | Discharge: 2021-09-05 | Disposition: A | Discharge status: Home / Self Care | Payer: Medicaid Other | Attending: Obstetrics | Admitting: Obstetrics

## 2021-09-04 DIAGNOSIS — R102 Pelvic and perineal pain: Secondary | ICD-10-CM | POA: Diagnosis not present

## 2021-09-04 DIAGNOSIS — O209 Hemorrhage in early pregnancy, unspecified: Secondary | ICD-10-CM | POA: Diagnosis not present

## 2021-09-04 DIAGNOSIS — Z3A00 Weeks of gestation of pregnancy not specified: Secondary | ICD-10-CM | POA: Diagnosis not present

## 2021-09-04 DIAGNOSIS — R42 Dizziness and giddiness: Secondary | ICD-10-CM | POA: Diagnosis not present

## 2021-09-04 DIAGNOSIS — O26899 Other specified pregnancy related conditions, unspecified trimester: Secondary | ICD-10-CM | POA: Diagnosis not present

## 2021-09-04 MED ORDER — KETOROLAC TROMETHAMINE 60 MG/2ML IM SOLN
60.0000 mg | Freq: Once | INTRAMUSCULAR | Status: AC
  Administered 2021-09-04: 60 mg via INTRAMUSCULAR
  Filled 2021-09-04: qty 2

## 2021-09-04 NOTE — ED Triage Notes (Signed)
Pt w vaginal/abd swelling/ pain after surgery Thursday to remove bartholin gland cyst, vaginal birth 08/18/2021. Pt out of pain meds, states "something is going on with insurance."

## 2021-09-04 NOTE — ED Provider Notes (Signed)
Emergency Medicine Provider OB Triage Evaluation Note  Jill Conley is a 31 y.o. female, [phone], at Unknown gestation who presents to the emergency department with complaints of vaginal pain, vaginal bleeding, dizziness.  Patient states that she has had increased vaginal bleeding over the last 2 days.  Patient is unable to quantify how aspirin she has had.  Patient states that she has had increased vaginal pain over the last 2 days.  States that she has not had had her Percocet pain medications due to complications with her insurance company.  Patient reports that she has had dizziness when ambulating over the last 2 days as well.  Denies any syncopal episodes.  [place] translator was used to conduct this interview  Review of  Systems  Positive: Vaginal pain, vaginal bleeding, Negative:   Physical Exam  BP 99/66 (BP Location: Left Arm)    Pulse (!) 56    Temp 97.9 F (36.6 C)    Resp 18    SpO2 100%  General: Awake, no distress  HEENT: Atraumatic  Resp: Normal effort  Cardiac: Normal rate Abd: Nondistended, nontender  MSK: Moves all extremities without difficulty Neuro: Speech clear  Medical Decision Making  Pt evaluated for pregnancy concern and is stable for transfer to MAU. Pt is in agreement with plan for transfer.  9:02 PM Discussed with MAU APP, Samara Deist, who accepts patient in transfer.  Clinical Impression  No diagnosis found.     Berneice Heinrich 09/04/21 2108    Arby Barrette, MD 09/08/21 [phone]

## 2021-09-04 NOTE — MAU Provider Note (Signed)
Event Date/Time   First Provider Initiated Contact with Patient 09/04/21 2336     S Ms. Jill Conley is a 31 y.o. [phone] non-pregnant female who delivered on 08/18/21 presents to MAU today with complaint of increasing vaginal pain and bleeding as well as dizziness upon standing due to the pain. Had a bartholin cyst drained in MAU on 08/30/21, then returned due to increased pain on 08/31/21 and was taken to the OR to drain a labial/vaginal hematoma. Has a Ward catheter in place from the 2nd procedure. Was prescribed a refill of oxycodone but was unable to get it due to insurance issues at the pharmacy (covered well at [facility], meds were switched to [facility] which will not fill without prior authorization). Took her last dose of oxycodone this morning and has taken Tylenol twice and one dose of Aleve at 6pm. On day 5 of antibiotics after bartholin's cyst drainage. Also having some increased vaginal bleeding but recently had to stop breastfeeding because she was told not to breastfeed while taking oxycodone.   Receives care at [facility], prenatal and postpartum records reviewed.  O BP 99/66 (BP Location: Left Arm)    Pulse (!) 56    Temp 97.9 F (36.6 C)    Resp 18    SpO2 100%  Physical Exam Vitals and nursing note reviewed.  Constitutional:      General: She is in acute distress (only able to lie on her back with her knees bent).     Appearance: She is normal weight. She is not ill-appearing.  HENT:     Head: Normocephalic and atraumatic.  Cardiovascular:     Rate and Rhythm: Regular rhythm. Bradycardia present.  Pulmonary:     Effort: Pulmonary effort is normal.  Abdominal:     Palpations: Abdomen is soft.  Genitourinary:   Musculoskeletal:        General: Normal range of motion.  Skin:    General: Skin is warm and dry.     Capillary Refill: Capillary refill takes less than 2 seconds.  Neurological:     Mental Status: She is alert and oriented to person, place, and time.   Psychiatric:        Mood and Affect: Mood normal.        Behavior: Behavior normal.        Thought Content: Thought content normal.        Judgment: Judgment normal.   MAU Course/MDM: Wound appears to be healing well, toradol IM and an ice pack ordered for pain relief. [facility] on [address] so she can pick up on her way home.   A Vaginal pain Bartholin cyst  P Discharge from MAU in stable condition with return precautions Follow up at [facility] as scheduled for postpartum follow up (has appointment on Thursday)  Allergies as of 09/05/2021   No Known Allergies      Medication List     TAKE these medications    acetaminophen 500 MG tablet Commonly known as: TYLENOL Take 500 mg by mouth every 6 (six) hours as needed for moderate pain.   hydrocortisone 2.5 % cream Apply 1 application topically 2 (two) times daily as needed (pain).   ibuprofen 600 MG tablet Commonly known as: ADVIL Take 1 tablet (600 mg total) by mouth every 6 (six) hours as needed.   oxyCODONE 5 MG immediate release tablet Commonly known as: Oxy IR/ROXICODONE Take 1 tablet (5 mg total) by mouth every  6 (six) hours as needed for up to 7 days for severe pain.   sulfamethoxazole-trimethoprim 800-160 MG tablet Commonly known as: BACTRIM DS Take 1 tablet by mouth 2 (two) times daily for 5 days.         Bernerd Limbo, [place] 09/05/2021 2:34 AM

## 2021-09-05 DIAGNOSIS — R102 Pelvic and perineal pain: Secondary | ICD-10-CM

## 2021-09-05 MED ORDER — IBUPROFEN 600 MG PO TABS
600.0000 mg | ORAL_TABLET | Freq: Four times a day (QID) | ORAL | 0 refills | Status: DC | PRN
Start: 2021-09-05 — End: 2022-02-20

## 2021-09-05 MED ORDER — OXYCODONE HCL 5 MG PO TABS: 5.0000 mg | ORAL_TABLET | Freq: Four times a day (QID) | ORAL | 0 refills | Status: AC | PRN

## 2021-09-05 NOTE — MAU Note (Signed)
Pt from [facility]. PP 12/23 vag delivery and Bartlyns cyst. removal on 1/5. C/O increase vag pain. Having and issue with pain medication and insurance.

## 2021-09-05 NOTE — Discharge Instructions (Signed)
Do not take the ibuprofen until 6am.

## 2021-09-06 ENCOUNTER — Telehealth: Payer: Self-pay

## 2021-09-06 NOTE — Telephone Encounter (Signed)
° °  Telephone encounter was:  Successful.  09/06/2021 Name: Guerline Happ MRN: [id number] DOB: 11-Jun-1991  Aiden Rao is a 31 y.o. year old female who is a primary care patient of Patient, No Pcp Per (Inactive) . The community resource team was consulted for assistance with Interpreter [phone] called patient and she requested that we email the PCP resources to her at [email or web address]  Care guide performed the following interventions: Patient provided with information about care guide support team and interviewed to confirm resource needs.  Follow Up Plan:  No further follow up planned at this time. The patient has been provided with needed resources.    Lenard Forth Care Guide, Embedded Care Coordination [facility], Care Management  [phone] [place], [place], [place] [address] Phone: [phone] Email: [email or web address]

## 2021-09-15 NOTE — Op Note (Signed)
08/31/2021  9:18 AM  PATIENT:  Jill Conley  31 y.o. female  PRE-OPERATIVE DIAGNOSIS:  infected labia  POST-OPERATIVE DIAGNOSIS:  BARTHOLIN GLAND CYST hematoma  PROCEDURE:  Procedure(s): Incision and Drainage of Bartholins cyst (N/A)  SURGEON:  Surgeon(s) and Role:    Carrington Clamp, MD - Primary  ANESTHESIA:   general  EBL:  10 mL   DRAINS: word catheter   LOCAL MEDICATIONS USED:  LIDOCAINE, surgiflo  SPECIMEN:  No Specimen  DISPOSITION OF SPECIMEN:  N/A  COUNTS:  YES  TOURNIQUET:  * No tourniquets in log *  DICTATION: .Note written in EPIC  PLAN OF CARE: Discharge to home after PACU  PATIENT DISPOSITION:  PACU - hemodynamically stable.   Delay start of Pharmacological VTE agent (>24hrs) due to surgical blood loss or risk of bleeding: not applicable.  Findings : 4x5 cm hematoma in BGD cyst extending to vaginal wall.  Bleeding continued after evacuation but stopped with pressure and surgiflo.  Word cath placed.   Technique:   After adequate general anesthesia was obtained, the pt was prepped and draped in usual sterile fashion.  The above findings were noted.  The site of the former BGD was incision seen and was closed over.  A scapel opened the former incision and extended it about 2-2mm.  About 10 cc of clot was evacutated from the area including tracking to vaginal wall.  BRB was seen fairly brisk after evacuation and hemotoma was presumed from this vessel which could not be seen.  Surgiflo was administered and pressure held for several minutes.  Once hemostasis was achieved, the word catheter was placed and inflated 2 cc and again we watched for bleeding.  No further bleeding or hematoma was seen and pt was returned to recovery room in stable condition.

## 2021-09-15 NOTE — Brief Op Note (Signed)
08/31/2021  9:18 AM  PATIENT:  Jill Conley  31 y.o. female  PRE-OPERATIVE DIAGNOSIS:  infected labia  POST-OPERATIVE DIAGNOSIS:  BARTHOLIN GLAND CYST  PROCEDURE:  Procedure(s): Incision and Drainage of Bartholins cyst (N/A)  SURGEON:  Surgeon(s) and Role:    Carrington Clamp, MD - Primary  ANESTHESIA:   general  EBL:  10 mL   DRAINS:  word catheter    LOCAL MEDICATIONS USED:  LIDOCAINE, surgiflo  SPECIMEN:  No Specimen  DISPOSITION OF SPECIMEN:  N/A  COUNTS:  YES  TOURNIQUET:  * No tourniquets in log *  DICTATION: .Note written in EPIC  PLAN OF CARE: Discharge to home after PACU  PATIENT DISPOSITION:  PACU - hemodynamically stable.   Delay start of Pharmacological VTE agent (>24hrs) due to surgical blood loss or risk of bleeding: not applicable

## 2021-09-21 DIAGNOSIS — Z01419 Encounter for gynecological examination (general) (routine) without abnormal findings: Secondary | ICD-10-CM | POA: Diagnosis not present

## 2021-09-21 DIAGNOSIS — R300 Dysuria: Secondary | ICD-10-CM | POA: Diagnosis not present

## 2021-09-21 DIAGNOSIS — Z124 Encounter for screening for malignant neoplasm of cervix: Secondary | ICD-10-CM | POA: Diagnosis not present

## 2021-09-27 DIAGNOSIS — Z419 Encounter for procedure for purposes other than remedying health state, unspecified: Secondary | ICD-10-CM | POA: Diagnosis not present

## 2021-10-25 DIAGNOSIS — Z419 Encounter for procedure for purposes other than remedying health state, unspecified: Secondary | ICD-10-CM | POA: Diagnosis not present

## 2021-11-25 DIAGNOSIS — Z419 Encounter for procedure for purposes other than remedying health state, unspecified: Secondary | ICD-10-CM | POA: Diagnosis not present

## 2021-12-10 DIAGNOSIS — Z0130 Encounter for examination of blood pressure without abnormal findings: Secondary | ICD-10-CM | POA: Diagnosis not present

## 2021-12-10 DIAGNOSIS — Z1159 Encounter for screening for other viral diseases: Secondary | ICD-10-CM | POA: Diagnosis not present

## 2021-12-10 DIAGNOSIS — Z0000 Encounter for general adult medical examination without abnormal findings: Secondary | ICD-10-CM | POA: Diagnosis not present

## 2021-12-10 DIAGNOSIS — Z6838 Body mass index (BMI) 38.0-38.9, adult: Secondary | ICD-10-CM | POA: Diagnosis not present

## 2021-12-25 DIAGNOSIS — Z419 Encounter for procedure for purposes other than remedying health state, unspecified: Secondary | ICD-10-CM | POA: Diagnosis not present

## 2022-01-25 DIAGNOSIS — Z419 Encounter for procedure for purposes other than remedying health state, unspecified: Secondary | ICD-10-CM | POA: Diagnosis not present

## 2022-02-19 ENCOUNTER — Other Ambulatory Visit: Payer: Self-pay

## 2022-02-19 ENCOUNTER — Encounter (HOSPITAL_COMMUNITY): Payer: Self-pay

## 2022-02-19 ENCOUNTER — Inpatient Hospital Stay (HOSPITAL_COMMUNITY)
Admission: AD | Admit: 2022-02-19 | Discharge: 2022-02-20 | Disposition: A | Discharge status: Home / Self Care | Payer: Medicaid Other | Attending: Obstetrics and Gynecology | Admitting: Obstetrics and Gynecology

## 2022-02-19 DIAGNOSIS — O23591 Infection of other part of genital tract in pregnancy, first trimester: Secondary | ICD-10-CM | POA: Insufficient documentation

## 2022-02-19 DIAGNOSIS — B9689 Other specified bacterial agents as the cause of diseases classified elsewhere: Secondary | ICD-10-CM | POA: Insufficient documentation

## 2022-02-19 DIAGNOSIS — Z3A01 Less than 8 weeks gestation of pregnancy: Secondary | ICD-10-CM | POA: Diagnosis not present

## 2022-02-19 DIAGNOSIS — N9089 Other specified noninflammatory disorders of vulva and perineum: Secondary | ICD-10-CM

## 2022-02-19 DIAGNOSIS — N760 Acute vaginitis: Secondary | ICD-10-CM | POA: Diagnosis present

## 2022-02-19 LAB — URINALYSIS, ROUTINE W REFLEX MICROSCOPIC
Bilirubin Urine: NEGATIVE
Glucose, UA: NEGATIVE mg/dL
Hgb urine dipstick: NEGATIVE
Ketones, ur: NEGATIVE mg/dL
Nitrite: NEGATIVE
Protein, ur: NEGATIVE mg/dL
Specific Gravity, Urine: 1.023 (ref 1.005–1.030)
pH: 5 (ref 5.0–8.0)

## 2022-02-19 LAB — OB RESULTS CONSOLE GC/CHLAMYDIA
Chlamydia: NEGATIVE
Neisseria Gonorrhea: NEGATIVE

## 2022-02-19 LAB — WET PREP, GENITAL
Sperm: NONE SEEN
Trich, Wet Prep: NONE SEEN
WBC, Wet Prep HPF POC: >=10 — AB (ref <10)
Yeast Wet Prep HPF POC: NONE SEEN

## 2022-02-19 LAB — PREGNANCY, URINE: Preg Test, Ur: POSITIVE — AB

## 2022-02-19 NOTE — MAU Note (Signed)
Pt says she has a rash an Vag area started on Friday - she bought a cream OTC - but it still itches . Used different body wash

## 2022-02-20 DIAGNOSIS — N9089 Other specified noninflammatory disorders of vulva and perineum: Secondary | ICD-10-CM | POA: Diagnosis not present

## 2022-02-20 DIAGNOSIS — Z3A01 Less than 8 weeks gestation of pregnancy: Secondary | ICD-10-CM | POA: Diagnosis not present

## 2022-02-20 LAB — GC/CHLAMYDIA PROBE AMP (~~LOC~~) NOT AT ARMC
Chlamydia: NEGATIVE
Comment: NEGATIVE
Comment: NORMAL
Neisseria Gonorrhea: NEGATIVE

## 2022-02-20 MED ORDER — METRONIDAZOLE 0.75 % VA GEL: 1.0000 | Freq: Every day | VAGINAL | 1 refills | Status: DC

## 2022-02-20 NOTE — MAU Provider Note (Signed)
Event Date/Time   First Provider Initiated Contact with Patient 02/20/22 0147      S Jill Conley is a 31 y.o. [phone] pregnant female at [redacted]w[redacted]d who presents to MAU today with complaint of vulvar rash with increased discharge. Has been using Vagisil OTC topical cream which has largely relieved the rash but wanted to make sure she doesn't have a vaginal infection. Denies cramping or vaginal bleeding. No other physical complaints.  Receives OB care from [facility].   Pertinent items noted in HPI and remainder of comprehensive ROS otherwise negative.   O BP 98/65 (BP Location: Right Arm)   Pulse (!) 57   Temp 98 F (36.7 C)   Resp 20   Ht 5\' 3"  (1.6 m)   Wt 152 lb 1.6 oz (69 kg)   LMP 01/11/2022 (Exact Date)   SpO2 99%   BMI 26.94 kg/m  Physical Exam Vitals and nursing note reviewed.  Constitutional:      General: She is not in acute distress.    Appearance: Normal appearance. She is normal weight. She is not ill-appearing.  HENT:     Head: Normocephalic and atraumatic.  Eyes:     Pupils: Pupils are equal, round, and reactive to light.  Cardiovascular:     Rate and Rhythm: Normal rate and regular rhythm.  Pulmonary:     Effort: Pulmonary effort is normal.  Abdominal:     Palpations: Abdomen is soft.     Tenderness: There is no abdominal tenderness.  Genitourinary:    General: Normal vulva.     Vagina: Vaginal discharge present.  Musculoskeletal:        General: Normal range of motion.  Skin:    General: Skin is warm and dry.     Capillary Refill: Capillary refill takes less than 2 seconds.  Neurological:     Mental Status: She is alert and oriented to person, place, and time.  Psychiatric:        Mood and Affect: Mood normal.        Behavior: Behavior normal.        Thought Content: Thought content normal.        Judgment: Judgment normal.    Results for orders placed or performed during the hospital encounter of 02/19/22 (from the past 24 hour(s))   Wet prep, genital     Status: Abnormal   Collection Time: 02/19/22 10:03 PM  Result Value Ref Range   Yeast Wet Prep HPF POC NONE SEEN NONE SEEN   Trich, Wet Prep NONE SEEN NONE SEEN   Clue Cells Wet Prep HPF POC PRESENT (A) NONE SEEN   WBC, Wet Prep HPF POC >=10 (A) <10   Sperm NONE SEEN   GC/Chlamydia probe amp ([place])not at [facility]     Status: None   Collection Time: 02/19/22 10:08 PM  Result Value Ref Range   Neisseria Gonorrhea Negative    Chlamydia Negative    Comment Normal Reference Ranger Chlamydia - Negative    Comment      Normal Reference Range Neisseria Gonorrhea - Negative  Urinalysis, Routine w reflex microscopic Urine, Clean Catch     Status: Abnormal   Collection Time: 02/19/22 10:47 PM  Result Value Ref Range   Color, Urine YELLOW YELLOW   APPearance HAZY (A) CLEAR   Specific Gravity, Urine 1.023 1.005 - 1.030   pH 5.0 5.0 - 8.0   Glucose, UA NEGATIVE NEGATIVE mg/dL   Hgb urine dipstick NEGATIVE NEGATIVE  Bilirubin Urine NEGATIVE NEGATIVE   Ketones, ur NEGATIVE NEGATIVE mg/dL   Protein, ur NEGATIVE NEGATIVE mg/dL   Nitrite NEGATIVE NEGATIVE   Leukocytes,Ua MODERATE (A) NEGATIVE   RBC / HPF 0-5 0 - 5 RBC/hpf   WBC, UA 6-10 0 - 5 WBC/hpf   Bacteria, UA RARE (A) NONE SEEN   Squamous Epithelial / LPF 0-5 0 - 5   Mucus PRESENT    MDM/MAU Course: [facility]ix exam normal aside from discharge. Pt reassured that discomfort related to BV, no lesions or rash noted. Can continue to use Vagisil for external comfort along with metrogel for BV treatment.  A Bacterial vaginosis [redacted] weeks gestation of pregnancy  P Discharge from MAU in stable condition with first trimester precautions Follow up at [facility] as scheduled for prenatal care  Allergies as of 02/20/2022   No Known Allergies      Medication List     STOP taking these medications    hydrocortisone 2.5 % cream   ibuprofen 600 MG tablet Commonly known as: ADVIL       TAKE these  medications    acetaminophen 500 MG tablet Commonly known as: TYLENOL Take 500 mg by mouth every 6 (six) hours as needed for moderate pain.   metroNIDAZOLE 0.75 % vaginal gel Commonly known as: METROGEL Place 1 Applicatorful vaginally at bedtime. Apply one applicatorful to vagina at bedtime for 5 days         Bernerd Limbo, [place] 02/20/2022 1:47 AM

## 2022-02-24 DIAGNOSIS — Z419 Encounter for procedure for purposes other than remedying health state, unspecified: Secondary | ICD-10-CM | POA: Diagnosis not present

## 2022-03-13 DIAGNOSIS — Z3480 Encounter for supervision of other normal pregnancy, unspecified trimester: Secondary | ICD-10-CM | POA: Diagnosis not present

## 2022-03-13 DIAGNOSIS — O26841 Uterine size-date discrepancy, first trimester: Secondary | ICD-10-CM | POA: Diagnosis not present

## 2022-03-13 DIAGNOSIS — Z113 Encounter for screening for infections with a predominantly sexual mode of transmission: Secondary | ICD-10-CM | POA: Diagnosis not present

## 2022-03-27 DIAGNOSIS — Z419 Encounter for procedure for purposes other than remedying health state, unspecified: Secondary | ICD-10-CM | POA: Diagnosis not present

## 2022-04-05 DIAGNOSIS — D2239 Melanocytic nevi of other parts of face: Secondary | ICD-10-CM | POA: Diagnosis not present

## 2022-04-05 DIAGNOSIS — L299 Pruritus, unspecified: Secondary | ICD-10-CM | POA: Diagnosis not present

## 2022-04-12 DIAGNOSIS — Z3480 Encounter for supervision of other normal pregnancy, unspecified trimester: Secondary | ICD-10-CM | POA: Diagnosis not present

## 2022-04-12 LAB — OB RESULTS CONSOLE HIV ANTIBODY (ROUTINE TESTING)
HIV: NONREACTIVE
HIV: NONREACTIVE

## 2022-04-12 LAB — HEPATITIS C ANTIBODY
HCV Ab: NEGATIVE
HCV Ab: NEGATIVE

## 2022-04-12 LAB — OB RESULTS CONSOLE VARICELLA ZOSTER ANTIBODY, IGG: Varicella: IMMUNE

## 2022-04-12 LAB — OB RESULTS CONSOLE HEPATITIS B SURFACE ANTIGEN
Hepatitis B Surface Ag: NEGATIVE
Hepatitis B Surface Ag: NEGATIVE

## 2022-04-12 LAB — OB RESULTS CONSOLE RPR
RPR: NONREACTIVE
RPR: NONREACTIVE

## 2022-04-12 LAB — OB RESULTS CONSOLE RUBELLA ANTIBODY, IGM
Rubella: IMMUNE
Rubella: IMMUNE

## 2022-04-27 DIAGNOSIS — Z419 Encounter for procedure for purposes other than remedying health state, unspecified: Secondary | ICD-10-CM | POA: Diagnosis not present

## 2022-05-27 DIAGNOSIS — Z419 Encounter for procedure for purposes other than remedying health state, unspecified: Secondary | ICD-10-CM | POA: Diagnosis not present

## 2022-05-31 DIAGNOSIS — Z3A18 18 weeks gestation of pregnancy: Secondary | ICD-10-CM | POA: Diagnosis not present

## 2022-05-31 DIAGNOSIS — Z363 Encounter for antenatal screening for malformations: Secondary | ICD-10-CM | POA: Diagnosis not present

## 2022-06-27 DIAGNOSIS — Z419 Encounter for procedure for purposes other than remedying health state, unspecified: Secondary | ICD-10-CM | POA: Diagnosis not present

## 2022-07-02 ENCOUNTER — Inpatient Hospital Stay (HOSPITAL_COMMUNITY)
Admission: AD | Admit: 2022-07-02 | Discharge: 2022-07-02 | Disposition: A | Discharge status: Home / Self Care | Payer: Medicaid Other | Attending: Obstetrics and Gynecology | Admitting: Obstetrics and Gynecology

## 2022-07-02 ENCOUNTER — Encounter (HOSPITAL_COMMUNITY): Payer: Self-pay | Admitting: Obstetrics & Gynecology

## 2022-07-02 DIAGNOSIS — O212 Late vomiting of pregnancy: Secondary | ICD-10-CM | POA: Diagnosis not present

## 2022-07-02 DIAGNOSIS — Z3A23 23 weeks gestation of pregnancy: Secondary | ICD-10-CM | POA: Diagnosis not present

## 2022-07-02 DIAGNOSIS — R109 Unspecified abdominal pain: Secondary | ICD-10-CM | POA: Diagnosis not present

## 2022-07-02 DIAGNOSIS — O26892 Other specified pregnancy related conditions, second trimester: Secondary | ICD-10-CM | POA: Diagnosis not present

## 2022-07-02 DIAGNOSIS — O4703 False labor before 37 completed weeks of gestation, third trimester: Secondary | ICD-10-CM | POA: Diagnosis not present

## 2022-07-02 LAB — COMPREHENSIVE METABOLIC PANEL
ALT: 12 U/L (ref 0–44)
AST: 19 U/L (ref 15–41)
Albumin: 3.4 g/dL — ABNORMAL LOW (ref 3.5–5.0)
Alkaline Phosphatase: 86 U/L (ref 38–126)
Anion gap: 14 (ref 5–15)
BUN: 6 mg/dL (ref 6–20)
CO2: 24 mmol/L (ref 22–32)
Calcium: 9.3 mg/dL (ref 8.9–10.3)
Chloride: 98 mmol/L (ref 98–111)
Creatinine, Ser: 0.52 mg/dL (ref 0.44–1.00)
GFR, Estimated: >60 mL/min (ref >60)
Glucose, Bld: 104 mg/dL — ABNORMAL HIGH (ref 70–99)
Potassium: 3.2 mmol/L — ABNORMAL LOW (ref 3.5–5.1)
Sodium: 136 mmol/L (ref 135–145)
Total Bilirubin: 0.8 mg/dL (ref 0.3–1.2)
Total Protein: 7.1 g/dL (ref 6.5–8.1)

## 2022-07-02 LAB — URINALYSIS, ROUTINE W REFLEX MICROSCOPIC
Bacteria, UA: NONE SEEN
Bilirubin Urine: NEGATIVE
Glucose, UA: NEGATIVE mg/dL
Hgb urine dipstick: NEGATIVE
Ketones, ur: NEGATIVE mg/dL
Nitrite: NEGATIVE
Protein, ur: 30 mg/dL — AB
Specific Gravity, Urine: 1.024 (ref 1.005–1.030)
pH: 6 (ref 5.0–8.0)

## 2022-07-02 LAB — WET PREP, GENITAL
Clue Cells Wet Prep HPF POC: NONE SEEN
Sperm: NONE SEEN
Trich, Wet Prep: NONE SEEN
WBC, Wet Prep HPF POC: >=10 — AB (ref <10)
Yeast Wet Prep HPF POC: NONE SEEN

## 2022-07-02 LAB — LIPASE, BLOOD: Lipase: 31 U/L (ref 11–51)

## 2022-07-02 LAB — CBC
HCT: 38.8 % (ref 36.0–46.0)
Hemoglobin: 12.9 g/dL (ref 12.0–15.0)
MCH: 30.9 pg (ref 26.0–34.0)
MCHC: 33.2 g/dL (ref 30.0–36.0)
MCV: 93 fL (ref 80.0–100.0)
Platelets: 264 10*3/uL (ref 150–400)
RBC: 4.17 MIL/uL (ref 3.87–5.11)
RDW: 12.4 % (ref 11.5–15.5)
WBC: 13.5 10*3/uL — ABNORMAL HIGH (ref 4.0–10.5)
nRBC: 0 % (ref 0.0–0.2)

## 2022-07-02 LAB — FETAL FIBRONECTIN: Fetal Fibronectin: NEGATIVE

## 2022-07-02 LAB — GC/CHLAMYDIA PROBE AMP (~~LOC~~) NOT AT ARMC
Chlamydia: NEGATIVE
Comment: NEGATIVE
Comment: NORMAL
Neisseria Gonorrhea: NEGATIVE

## 2022-07-02 MED ORDER — NIFEDIPINE 10 MG PO CAPS
10.0000 mg | ORAL_CAPSULE | ORAL | Status: AC
  Administered 2022-07-02 (×3): 10 mg via ORAL
  Filled 2022-07-02 (×2): qty 1

## 2022-07-02 MED ORDER — ONDANSETRON HCL 4 MG/2ML IJ SOLN
4.0000 mg | Freq: Once | INTRAMUSCULAR | Status: AC
  Administered 2022-07-02: 4 mg via INTRAVENOUS
  Filled 2022-07-02: qty 2

## 2022-07-02 MED ORDER — ACETAMINOPHEN 325 MG PO TABS
650.0000 mg | ORAL_TABLET | Freq: Once | ORAL | Status: AC
  Administered 2022-07-02: 650 mg via ORAL
  Filled 2022-07-02: qty 2

## 2022-07-02 MED ORDER — NIFEDIPINE 10 MG PO CAPS: 10.0000 mg | ORAL_CAPSULE | ORAL | Status: DC

## 2022-07-02 MED ORDER — LACTATED RINGERS IV BOLUS
1000.0000 mL | Freq: Once | INTRAVENOUS | Status: AC
  Administered 2022-07-02: 1000 mL via INTRAVENOUS

## 2022-07-02 MED ORDER — TERBUTALINE SULFATE 1 MG/ML IJ SOLN
0.2500 mg | Freq: Once | INTRAMUSCULAR | Status: AC
  Administered 2022-07-02: 0.25 mg via SUBCUTANEOUS
  Filled 2022-07-02: qty 1

## 2022-07-02 MED ORDER — NIFEDIPINE 10 MG PO CAPS: 10.0000 mg | ORAL_CAPSULE | ORAL | 0 refills | Status: DC | PRN

## 2022-07-02 NOTE — MAU Note (Signed)
.  Jill Conley is a 31 y.o. at [redacted]w[redacted]d here in MAU reporting: abdominal pain that began at 2200-"contraction like" pain. Denies SROM, vaginal bleeding or bloody show. Does report white vaginal discharge with foul odor. Reports nausea and vomiting that began with abdominal pain. Reports + fetal movement    Onset of complaint: 2200 Pain score: 10 Vitals:   07/02/22 0341  BP: (!) 109/56  Pulse: 81  Resp: 17  Temp: 98.1 F (36.7 C)     FHT:145 Lab orders placed from triage:  None

## 2022-07-02 NOTE — MAU Note (Signed)
Medications and IV fluids explained to pt prior to administration. Pt agreeable with IV and medications. Pt reports pain has resolved. [place] interpreter remains on stratus.   Christiane Ha #[id number]

## 2022-07-02 NOTE — MAU Note (Addendum)
Patient feels better after eating crackers and drinking juice. Denies pain. Patient walked out with her husband. Denies lightheadedness. MD made aware.

## 2022-07-02 NOTE — MAU Note (Signed)
External fetal and labor monitors applied-uterus soft to palpation-unable to palpate contraction with pt report of abdominal pain

## 2022-07-02 NOTE — MAU Note (Addendum)
Husband on pts cell phone-pt requests that she wants her husband to interpret. Pt states contractions now noted on external monitor are consistent with what she is feeling. States contractions are as strong as before but are only 10 sec.in duration. Marco Collie CNM at bedside . Order for procardia - pt agreeable.

## 2022-07-02 NOTE — MAU Note (Signed)
Patient reports feeling light headed with position changes. BP 94/54. Notified MD. Husband at bedside. Cranberry juice and graham crackers given as patient has not eaten since yesterday at 1700. Denies sandwich tray offer.

## 2022-07-02 NOTE — MAU Note (Signed)
Jill Conley CNM updated on pt . Continuous toco   Intermittent cardio.

## 2022-07-02 NOTE — MAU Provider Note (Addendum)
History     CSN: [id number]  Arrival date and time: 07/02/22 0236   Event Date/Time   First Provider Initiated Contact with Patient 07/02/22 0348      Chief Complaint  Patient presents with   Abdominal Pain   31 y.o. [id number] @23 .3 wks presenting with abdominal pain. Reports onset about 6 hours ago. Pain is intermittent in her mid abdomen. She is unsure how frequent it is. She thinks it may be ctx. Denies VB or LOF but reports a white malodorous vaginal discharge. Reports +FM. States she had an episode of diarrhea before the pain started and an episode of vomiting after.    OB History     Gravida  4   Para  3   Term  1   Preterm  2   AB      Living  3      SAB      IAB      Ectopic      Multiple  0   Live Births  3           Past Medical History:  Diagnosis Date   Medical history non-contributory     Past Surgical History:  Procedure Laterality Date   BARTHOLIN CYST MARSUPIALIZATION N/A 08/31/2021   Procedure: Incision and Drainage of Bartholins cyst;  Surgeon: Bobbye Charleston, MD;  Location: [facility];  Service: Gynecology;  Laterality: N/A;   HEMORRHOID SURGERY     NO PAST SURGERIES     WISDOM TOOTH EXTRACTION  2020    Family History  Problem Relation Age of Onset   Diabetes Neg Hx    Hypertension Neg Hx     Social History   Tobacco Use   Smoking status: Never  Vaping Use   Vaping Use: Never used  Substance Use Topics   Alcohol use: Never   Drug use: Never    Allergies: No Known Allergies  Medications Prior to Admission  Medication Sig Dispense Refill Last Dose   Prenatal Vit-Fe Fumarate-FA (PRENATAL VITAMIN PO) Take 1 tablet by mouth daily.   07/01/2022   acetaminophen (TYLENOL) 500 MG tablet Take 500 mg by mouth every 6 (six) hours as needed for moderate pain.      metroNIDAZOLE (METROGEL) 0.75 % vaginal gel Place 1 Applicatorful vaginally at bedtime. Apply one applicatorful to vagina at bedtime for 5 days 70 g 1     Review of  Systems  Gastrointestinal:  Positive for abdominal pain, diarrhea and vomiting.  Genitourinary:  Positive for vaginal discharge. Negative for vaginal bleeding.   Physical Exam   Blood pressure 110/60, pulse 85, temperature 98.8 F (37.1 C), temperature source Oral, resp. rate 17, last menstrual period 01/11/2022, SpO2 95 %, currently breastfeeding.  Physical Exam Vitals and nursing note reviewed. Exam conducted with a chaperone present.  Constitutional:      General: She is not in acute distress.    Appearance: Normal appearance.  HENT:     Head: Normocephalic and atraumatic.  Cardiovascular:     Rate and Rhythm: Normal rate.  Pulmonary:     Effort: Pulmonary effort is normal. No respiratory distress.  Abdominal:     General: There is no distension.     Palpations: Abdomen is soft. There is no mass.     Tenderness: There is no abdominal tenderness. There is no guarding or rebound.     Hernia: No hernia is present.  Genitourinary:    Comments: VE: closed/thick Musculoskeletal:  General: Normal range of motion.     Cervical back: Normal range of motion.  Skin:    General: Skin is warm and dry.  Neurological:     General: No focal deficit present.     Mental Status: She is alert and oriented to person, place, and time.  Psychiatric:        Mood and Affect: Mood normal.        Behavior: Behavior normal.   EFM: 135 bpm, mod variability, + accels, rare variable decels Toco: indeterminate   Results for orders placed or performed during the hospital encounter of 07/02/22 (from the past 24 hour(s))  Wet prep, genital     Status: Abnormal   Collection Time: 07/02/22  4:00 AM  Result Value Ref Range   Yeast Wet Prep HPF POC NONE SEEN NONE SEEN   Trich, Wet Prep NONE SEEN NONE SEEN   Clue Cells Wet Prep HPF POC NONE SEEN NONE SEEN   WBC, Wet Prep HPF POC >=10 (A) <10   Sperm NONE SEEN   Fetal fibronectin     Status: None   Collection Time: 07/02/22  4:00 AM  Result  Value Ref Range   Fetal Fibronectin NEGATIVE NEGATIVE  CBC     Status: Abnormal   Collection Time: 07/02/22  4:33 AM  Result Value Ref Range   WBC 13.5 (H) 4.0 - 10.5 K/uL   RBC 4.17 3.87 - 5.11 MIL/uL   Hemoglobin 12.9 12.0 - 15.0 g/dL   HCT 38.8 36.0 - 46.0 %   MCV 93.0 80.0 - 100.0 fL   MCH 30.9 26.0 - 34.0 pg   MCHC 33.2 30.0 - 36.0 g/dL   RDW 12.4 11.5 - 15.5 %   Platelets 264 150 - 400 K/uL   nRBC 0.0 0.0 - 0.2 %  Comprehensive metabolic panel     Status: Abnormal   Collection Time: 07/02/22  4:33 AM  Result Value Ref Range   Sodium 136 135 - 145 mmol/L   Potassium 3.2 (L) 3.5 - 5.1 mmol/L   Chloride 98 98 - 111 mmol/L   CO2 24 22 - 32 mmol/L   Glucose, Bld 104 (H) 70 - 99 mg/dL   BUN 6 6 - 20 mg/dL   Creatinine, Ser 0.52 0.44 - 1.00 mg/dL   Calcium 9.3 8.9 - 10.3 mg/dL   Total Protein 7.1 6.5 - 8.1 g/dL   Albumin 3.4 (L) 3.5 - 5.0 g/dL   AST 19 15 - 41 U/L   ALT 12 0 - 44 U/L   Alkaline Phosphatase 86 38 - 126 U/L   Total Bilirubin 0.8 0.3 - 1.2 mg/dL   GFR, Estimated >60 >60 mL/min   Anion gap 14 5 - 15  Lipase, blood     Status: None   Collection Time: 07/02/22  4:33 AM  Result Value Ref Range   Lipase 31 11 - 51 U/L  Urinalysis, Routine w reflex microscopic Urine, Clean Catch     Status: Abnormal   Collection Time: 07/02/22  4:33 AM  Result Value Ref Range   Color, Urine YELLOW YELLOW   APPearance HAZY (A) CLEAR   Specific Gravity, Urine 1.024 1.005 - 1.030   pH 6.0 5.0 - 8.0   Glucose, UA NEGATIVE NEGATIVE mg/dL   Hgb urine dipstick NEGATIVE NEGATIVE   Bilirubin Urine NEGATIVE NEGATIVE   Ketones, ur NEGATIVE NEGATIVE mg/dL   Protein, ur 30 (A) NEGATIVE mg/dL   Nitrite NEGATIVE NEGATIVE   Leukocytes,Ua MODERATE (  A) NEGATIVE   RBC / HPF 0-5 0 - 5 RBC/hpf   WBC, UA 21-50 0 - 5 WBC/hpf   Bacteria, UA NONE SEEN NONE SEEN   Squamous Epithelial / LPF 6-10 0 - 5   Mucus PRESENT    MAU Course  Procedures LR Zofran Terbutaline x2 Procardia  x3  MDM Labs ordered and reviewed. Unable to palpate ctx, ?UI on toco. 0545: Pt reports less pain, resting 0630: ctx q2-3 min on toco, pt uncomfortable, Procardia ordered. 0750: Pt reports less frequent ctx but still painful, irreg ctx on toco, pt also reports pain when she urinates. UA not clearly suggestive of UTI but UC added on. Transfer of care given to Dr. Isabella Stalling, [place], CNM  07/02/2022 8:06 AM   After receiving the second dose of terbutaline, the patient stopped having contractions for an hour.  Over the next 2 hours, the patient had 2 contractions both of which were mild and short.  I discussed patient with Dr. Marvel Plan, who is on-call for [facility].  FFN negative, cervix unchanged.  Will discharge patient to home with close follow-up in the office.  We will give the patient Procardia IR to take at home  NST:  Baseline: 140 Variability: Moderate Accelerations: None, but appropriate for gestational age Decelerations: None Contractions:   Assessment and Plan   1. [redacted] weeks gestation of pregnancy   2. Threatened premature labor in third trimester    Discharge to home. Return precautions given Discussed how to use Nazlin[place], DO  07/02/2022

## 2022-07-03 LAB — CULTURE, OB URINE: Culture: 30000 — AB

## 2022-07-26 DIAGNOSIS — Z3480 Encounter for supervision of other normal pregnancy, unspecified trimester: Secondary | ICD-10-CM | POA: Diagnosis not present

## 2022-07-27 DIAGNOSIS — Z419 Encounter for procedure for purposes other than remedying health state, unspecified: Secondary | ICD-10-CM | POA: Diagnosis not present

## 2022-08-21 ENCOUNTER — Encounter (HOSPITAL_COMMUNITY): Payer: Self-pay | Admitting: Obstetrics and Gynecology

## 2022-08-21 ENCOUNTER — Inpatient Hospital Stay (HOSPITAL_COMMUNITY)
Admission: AD | Admit: 2022-08-21 | Discharge: 2022-08-21 | Disposition: A | Discharge status: Other Acute Inpatient Hospital | Payer: Medicaid Other | Attending: Obstetrics and Gynecology | Admitting: Obstetrics and Gynecology

## 2022-08-21 DIAGNOSIS — O99513 Diseases of the respiratory system complicating pregnancy, third trimester: Secondary | ICD-10-CM | POA: Insufficient documentation

## 2022-08-21 DIAGNOSIS — J069 Acute upper respiratory infection, unspecified: Secondary | ICD-10-CM | POA: Diagnosis not present

## 2022-08-21 DIAGNOSIS — Z3A29 29 weeks gestation of pregnancy: Secondary | ICD-10-CM | POA: Diagnosis not present

## 2022-08-21 DIAGNOSIS — Z1152 Encounter for screening for COVID-19: Secondary | ICD-10-CM | POA: Insufficient documentation

## 2022-08-21 DIAGNOSIS — R109 Unspecified abdominal pain: Secondary | ICD-10-CM | POA: Diagnosis not present

## 2022-08-21 DIAGNOSIS — Z7689 Persons encountering health services in other specified circumstances: Secondary | ICD-10-CM | POA: Diagnosis not present

## 2022-08-21 DIAGNOSIS — O26893 Other specified pregnancy related conditions, third trimester: Secondary | ICD-10-CM | POA: Diagnosis present

## 2022-08-21 DIAGNOSIS — Z603 Acculturation difficulty: Secondary | ICD-10-CM | POA: Diagnosis not present

## 2022-08-21 DIAGNOSIS — Z3A30 30 weeks gestation of pregnancy: Secondary | ICD-10-CM | POA: Insufficient documentation

## 2022-08-21 DIAGNOSIS — Z3403 Encounter for supervision of normal first pregnancy, third trimester: Secondary | ICD-10-CM | POA: Diagnosis not present

## 2022-08-21 LAB — WET PREP, GENITAL
Clue Cells Wet Prep HPF POC: NONE SEEN
Sperm: NONE SEEN
Trich, Wet Prep: NONE SEEN
WBC, Wet Prep HPF POC: >=10 — AB (ref <10)
Yeast Wet Prep HPF POC: NONE SEEN

## 2022-08-21 LAB — OB RESULTS CONSOLE GC/CHLAMYDIA
Chlamydia: NEGATIVE
Neisseria Gonorrhea: NEGATIVE

## 2022-08-21 LAB — RESP PANEL BY RT-PCR (RSV, FLU A&B, COVID)  RVPGX2
Influenza A by PCR: NEGATIVE
Influenza B by PCR: NEGATIVE
Resp Syncytial Virus by PCR: NEGATIVE
SARS Coronavirus 2 by RT PCR: NEGATIVE

## 2022-08-21 MED ORDER — NIFEDIPINE 10 MG PO CAPS: 10.0000 mg | ORAL_CAPSULE | Freq: Four times a day (QID) | ORAL | Status: DC

## 2022-08-21 MED ORDER — LACTATED RINGERS IV BOLUS
1000.0000 mL | Freq: Once | INTRAVENOUS | Status: AC
  Administered 2022-08-21: 1000 mL via INTRAVENOUS

## 2022-08-21 MED ORDER — NIFEDIPINE 10 MG PO CAPS
30.0000 mg | ORAL_CAPSULE | Freq: Once | ORAL | Status: AC
  Administered 2022-08-21: 30 mg via ORAL
  Filled 2022-08-21: qty 3

## 2022-08-21 MED ORDER — NIFEDIPINE 10 MG PO CAPS
20.0000 mg | ORAL_CAPSULE | Freq: Three times a day (TID) | ORAL | Status: DC | PRN
  Filled 2022-08-21: qty 2

## 2022-08-21 MED ORDER — BETAMETHASONE SOD PHOS & ACET 6 (3-3) MG/ML IJ SUSP
12.0000 mg | INTRAMUSCULAR | Status: DC
  Administered 2022-08-21: 12 mg via INTRAMUSCULAR
  Filled 2022-08-21: qty 5

## 2022-08-21 MED ORDER — NIFEDIPINE 10 MG PO CAPS: 20.0000 mg | ORAL_CAPSULE | Freq: Three times a day (TID) | ORAL | Status: DC

## 2022-08-21 MED ORDER — ACETAMINOPHEN 325 MG PO TABS
650.0000 mg | ORAL_TABLET | Freq: Four times a day (QID) | ORAL | Status: DC | PRN
  Administered 2022-08-21: 650 mg via ORAL
  Filled 2022-08-21: qty 2

## 2022-08-21 NOTE — MAU Note (Signed)
Jill Conley is a 31 y.o. at [redacted]w[redacted]d here in MAU reporting: having lightheadedness, cough(productive- mucous), sore throat and congested.  Very tired, no strength for  anything. Fever and chills yesterday-didn't actually check temp. Headache. Took Mucinex, max strength fast max for cold and flu, took it last night and today.  Took 1 Tylenol 500mg  on 12/24 Denies bleeding, LOF,  reports +FM.  Onset of complaint: 3 days Pain score: throat Vitals:   08/21/22 1355 08/21/22 1357  BP:  106/80  Pulse:  68  Resp:  18  Temp:  98.2 F (36.8 C)  SpO2: 100% 99%     FHT:132 Lab orders placed from triage:    "Also has problems at times with pain in RLQ, sharp and brief, only last 5 seconds when it comes".

## 2022-08-21 NOTE — H&P (Addendum)
31 y.o. [redacted]w[redacted]d  [id number] comes in c/o lightheadedness, cough(productive- mucous), sore throat and congested.  Very tired, no strength for  anything. Fever and chills yesterday-didn't actually check temp. Headache. Took Mucinex, max strength fast max for cold and flu, took it last night and today.  Took 1 Tylenol 500mg  on 12/24.  Otherwise has good fetal movement and no bleeding.  Has had mild cramping for several days.  Past Medical History:  Diagnosis Date   Medical history non-contributory     Past Surgical History:  Procedure Laterality Date   BARTHOLIN CYST MARSUPIALIZATION N/A 08/31/2021   Procedure: Incision and Drainage of Bartholins cyst;  Surgeon: 10/29/2021, MD;  Location: [facility] OR;  Service: Gynecology;  Laterality: N/A;   HEMORRHOID SURGERY     NO PAST SURGERIES     WISDOM TOOTH EXTRACTION  2020    OB History  Gravida Para Term Preterm AB Living  4 3 1 2   3   SAB IAB Ectopic Multiple Live Births        0 3    # Outcome Date GA Lbr Len/2nd Weight Sex Delivery Anes PTL Lv  4 Current           3 Preterm 08/18/21 [redacted]w[redacted]d 08:30 / 00:04 2745 g F Vag-Spont EPI  LIV  2 Preterm 05/15/19 [redacted]w[redacted]d 06:04 / 00:24 2461 g M Vag-Spont EPI  LIV  1 Term      Vag-Spont       Social History   Socioeconomic History   Marital status: Married    Spouse name: Not on file   Number of children: Not on file   Years of education: Not on file   Highest education level: Not on file  Occupational History   Not on file  Tobacco Use   Smoking status: Never   Smokeless tobacco: Not on file  Vaping Use   Vaping Use: Never used  Substance and Sexual Activity   Alcohol use: Never   Drug use: Never   Sexual activity: Yes    Birth control/protection: None  Other Topics Concern   Not on file  Social History Narrative   Not on file   Social Determinants of Health   Financial Resource Strain: Not on file  Food Insecurity: Not on file  Transportation Needs: Not on file  Physical Activity: Not  on file  Stress: Not on file  Social Connections: Not on file  Intimate Partner Violence: Not on file   Patient has no known allergies.    Prenatal Transfer Tool  Maternal Diabetes: No Genetic Screening: Normal Maternal Ultrasounds/Referrals: Normal Fetal Ultrasounds or other Referrals:  None Maternal Substance Abuse:  No Significant Maternal Medications:  None Significant Maternal Lab Results: Group B Strep positive  Other PNC: History of preterm delivery x 2, G1- FT    Vitals:   08/21/22 1632 08/21/22 1637 08/21/22 1651 08/21/22 1653  BP: 109/68 109/68 116/68 116/68  Pulse: 76   76  Resp:    18  Temp:      TempSrc:      SpO2:      Weight:      Height:        Gen:  NAD SVE:  2.5/60 vtx per MAU staff FHTs:  125, good STV, NST R; Cat 1 tracing. Toco:  q 2-3   A/P   Planned admission for preterm labor at 30.4  Advised by MAU staff that NICU is closed and request patient be transferred if stable.  Per MAU staff pt's cramping unchanged over several days.  MAU staff will recheck cervix prior to transfer to verify no rapid progression. Betamethasone q24 x 2 doses, first dose administered Procardia 30mg   x 1 given  GBS pos  Resp viral labs returned negative for RSV, covid, flu   

## 2022-08-21 NOTE — MAU Provider Note (Addendum)
History     CSN: [id number]  Arrival date and time: 08/21/22 1325   Event Date/Time   First Provider Initiated Contact with Patient 08/21/22 1535      Chief Complaint  Patient presents with   Cough   Nasal Congestion   Sore Throat   Fatigue   Dizziness   30 yo [id number] @ [redacted]w[redacted]d presenting for upper respiratory symptoms along with abdominal pain.  She reports that she has been having cough, congestion, fatigue for the past 3 days.  She has also had headaches, sore throat.  Reports that her son also has similar symptoms.  Has not taken any test.  Took Mucinex yesterday as well as this morning and took Tylenol 500 mg yesterday.  Denies any gush of fluid, vaginal bleeding.  Patient states that she had preterm deliveries with 2 of her previous deliveries and she noticed dizziness prior to that.  Is concerned that she is going into labor.  Was seen when she was 23 weeks for preterm labor and received Procardia as well as terbutaline.     OB History     Gravida  4   Para  3   Term  1   Preterm  2   AB      Living  3      SAB      IAB      Ectopic      Multiple  0   Live Births  3           Past Medical History:  Diagnosis Date   Medical history non-contributory     Past Surgical History:  Procedure Laterality Date   BARTHOLIN CYST MARSUPIALIZATION N/A 08/31/2021   Procedure: Incision and Drainage of Bartholins cyst;  Surgeon: Carrington Clamp, MD;  Location: [facility] OR;  Service: Gynecology;  Laterality: N/A;   HEMORRHOID SURGERY     NO PAST SURGERIES     WISDOM TOOTH EXTRACTION  2020    Family History  Problem Relation Age of Onset   Diabetes Neg Hx    Hypertension Neg Hx     Social History   Tobacco Use   Smoking status: Never  Vaping Use   Vaping Use: Never used  Substance Use Topics   Alcohol use: Never   Drug use: Never    Allergies: No Known Allergies  Medications Prior to Admission  Medication Sig Dispense Refill Last Dose    acetaminophen (TYLENOL) 500 MG tablet Take 500 mg by mouth every 6 (six) hours as needed for moderate pain.   Past Week   Prenatal Vit-Fe Fumarate-FA (PRENATAL VITAMIN PO) Take 1 tablet by mouth daily.   08/21/2022   NIFEdipine (PROCARDIA) 10 MG capsule Take 1 capsule (10 mg total) by mouth every 4 (four) hours as needed (contractions). 30 capsule 0 More than a month    Review of Systems  Constitutional:  Positive for activity change.  HENT:  Positive for congestion and rhinorrhea.   Respiratory:  Positive for cough.   Gastrointestinal:  Positive for abdominal pain (contractions).  Genitourinary:  Positive for vaginal discharge. Negative for vaginal bleeding.  Neurological:  Negative for headaches.   Physical Exam   Blood pressure 100/64, pulse 70, temperature 98.2 F (36.8 C), temperature source Oral, resp. rate 18, height 5' 3.78" (1.62 m), weight 76.8 kg, last menstrual period 01/11/2022, SpO2 98 %, currently breastfeeding.  Physical Exam Vitals reviewed. Exam conducted with a chaperone present.  Constitutional:      Appearance: She  is well-developed.  HENT:     Head: Normocephalic.     Nose: Congestion and rhinorrhea present.     Mouth/Throat:     Mouth: Mucous membranes are moist.  Eyes:     Conjunctiva/sclera: Conjunctivae normal.  Cardiovascular:     Rate and Rhythm: Normal rate.  Pulmonary:     Effort: Pulmonary effort is normal.  Abdominal:     Palpations: Abdomen is soft.  Genitourinary:    Comments: Cervix 2.5/60/-2 Musculoskeletal:     Cervical back: Normal range of motion.  Skin:    General: Skin is warm.     Capillary Refill: Capillary refill takes less than 2 seconds.  Neurological:     Mental Status: She is alert.     MAU Course  Procedures  MDM Physical exam including pelvic exam with Wet prep COVID/flu/RSV panel IV fluids Tocolysis Betamethasone  Assessment and Plan  31 year old patient presenting with flulike symptoms.  Also having  occasional contractions.  Flulike symptoms Symptoms for the past 3 days.  Decreased p.o. fluid intake.  Fluid bolus given.  COVID/flu/RSV panel negative.  Likely some other URI.  Continue symptomatic management of this.  Preterm labor Fetal heart rate tracing category 1.  Contractions every 2 to 5 minutes.  Patient had a cervical exam at 23 weeks and she was closed and thick.  Currently 2.5 cm/60/-2.  Patient given Procardia for toco lysis and betamethasone for lung maturity.  Discussed patient with primary OB provider who will admit the patient.  Update Discussed patient with NICU team who indicates that if the patient can be transferred that would be optimal because there is no room in the NICU for preterm patient.  Patient will be transferred to [facility].  For site has accepted the patient.  Will check prior to transfer to make sure labor is not progressing.  1945: Patient reevaluated and cervix was unchanged.  Stable for transfer.  Celedonio Savage 08/21/2022, 4:04 PM

## 2022-08-22 DIAGNOSIS — Z3A30 30 weeks gestation of pregnancy: Secondary | ICD-10-CM | POA: Diagnosis not present

## 2022-08-22 LAB — GC/CHLAMYDIA PROBE AMP (~~LOC~~) NOT AT ARMC
Chlamydia: NEGATIVE
Comment: NEGATIVE
Comment: NORMAL
Neisseria Gonorrhea: NEGATIVE

## 2022-08-27 DIAGNOSIS — Z419 Encounter for procedure for purposes other than remedying health state, unspecified: Secondary | ICD-10-CM | POA: Diagnosis not present

## 2022-08-27 NOTE — L&D Delivery Note (Addendum)
Delivery Note Pt arrived at 8cm dilation and labored quickly to complete with uncontrollable urge to push.  I arrived to find Dr Janus Molder at bedside having SROM pt - clear fluid noted.  Pt pushed twice and at 3:44 AM a viable female was delivered via Vaginal, Spontaneous (Presentation: Right Occiput Anterior).  APGAR: 9, 9; weight pending Anterior and posterior shoulders delivered with same push; body easily followed   Cord clamped and cut and cord blood obtained.   Placenta status: Spontaneous, Intact. Scultz. Cord: 3 vessels with the following complications: None.  Cord pH: n/a  No IV access thus no antibx given for +GBS 184mg of fentanyl given after delivery 164mpitocin IM given prior to delivery of placenta   Anesthesia: None Episiotomy: None Lacerations: None Suture Repair:  n/a Est. Blood Loss (mL): 29  Mom to postpartum.  Baby to Couplet care / Skin to Skin.  CeIsaiah Serge/16/2024, 4:01 AM

## 2022-09-27 DIAGNOSIS — Z419 Encounter for procedure for purposes other than remedying health state, unspecified: Secondary | ICD-10-CM | POA: Diagnosis not present

## 2022-10-02 LAB — OB RESULTS CONSOLE GBS
GBS: POSITIVE
GBS: POSITIVE

## 2022-10-06 IMAGING — DX DG CHEST 2V
2 series · 2 of 2 positions shown · non-contrast
Comparison: None.

CLINICAL DATA: Chest pain and shortness of breath.

EXAM:
CHEST - 2 VIEW

[chest pa]
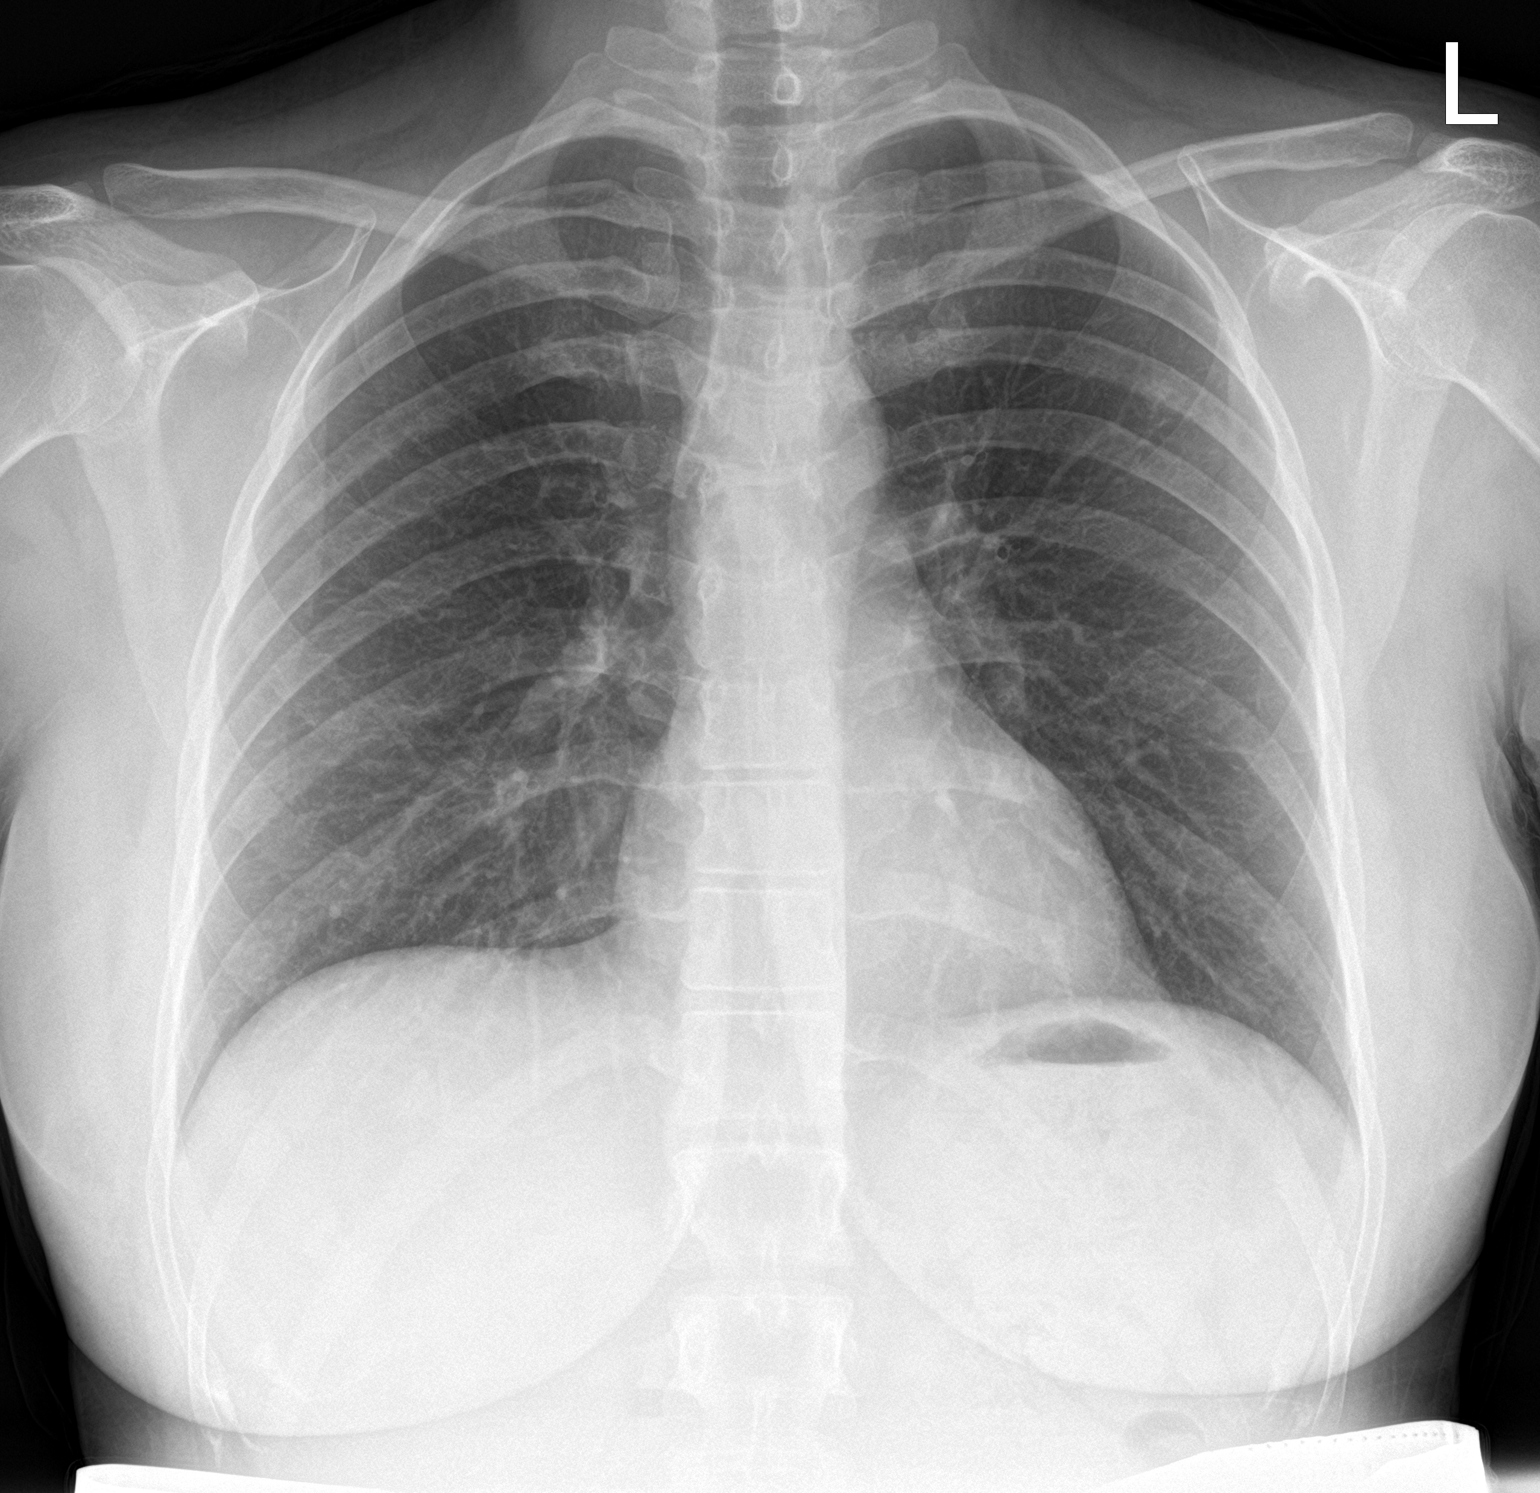

[chest lat]
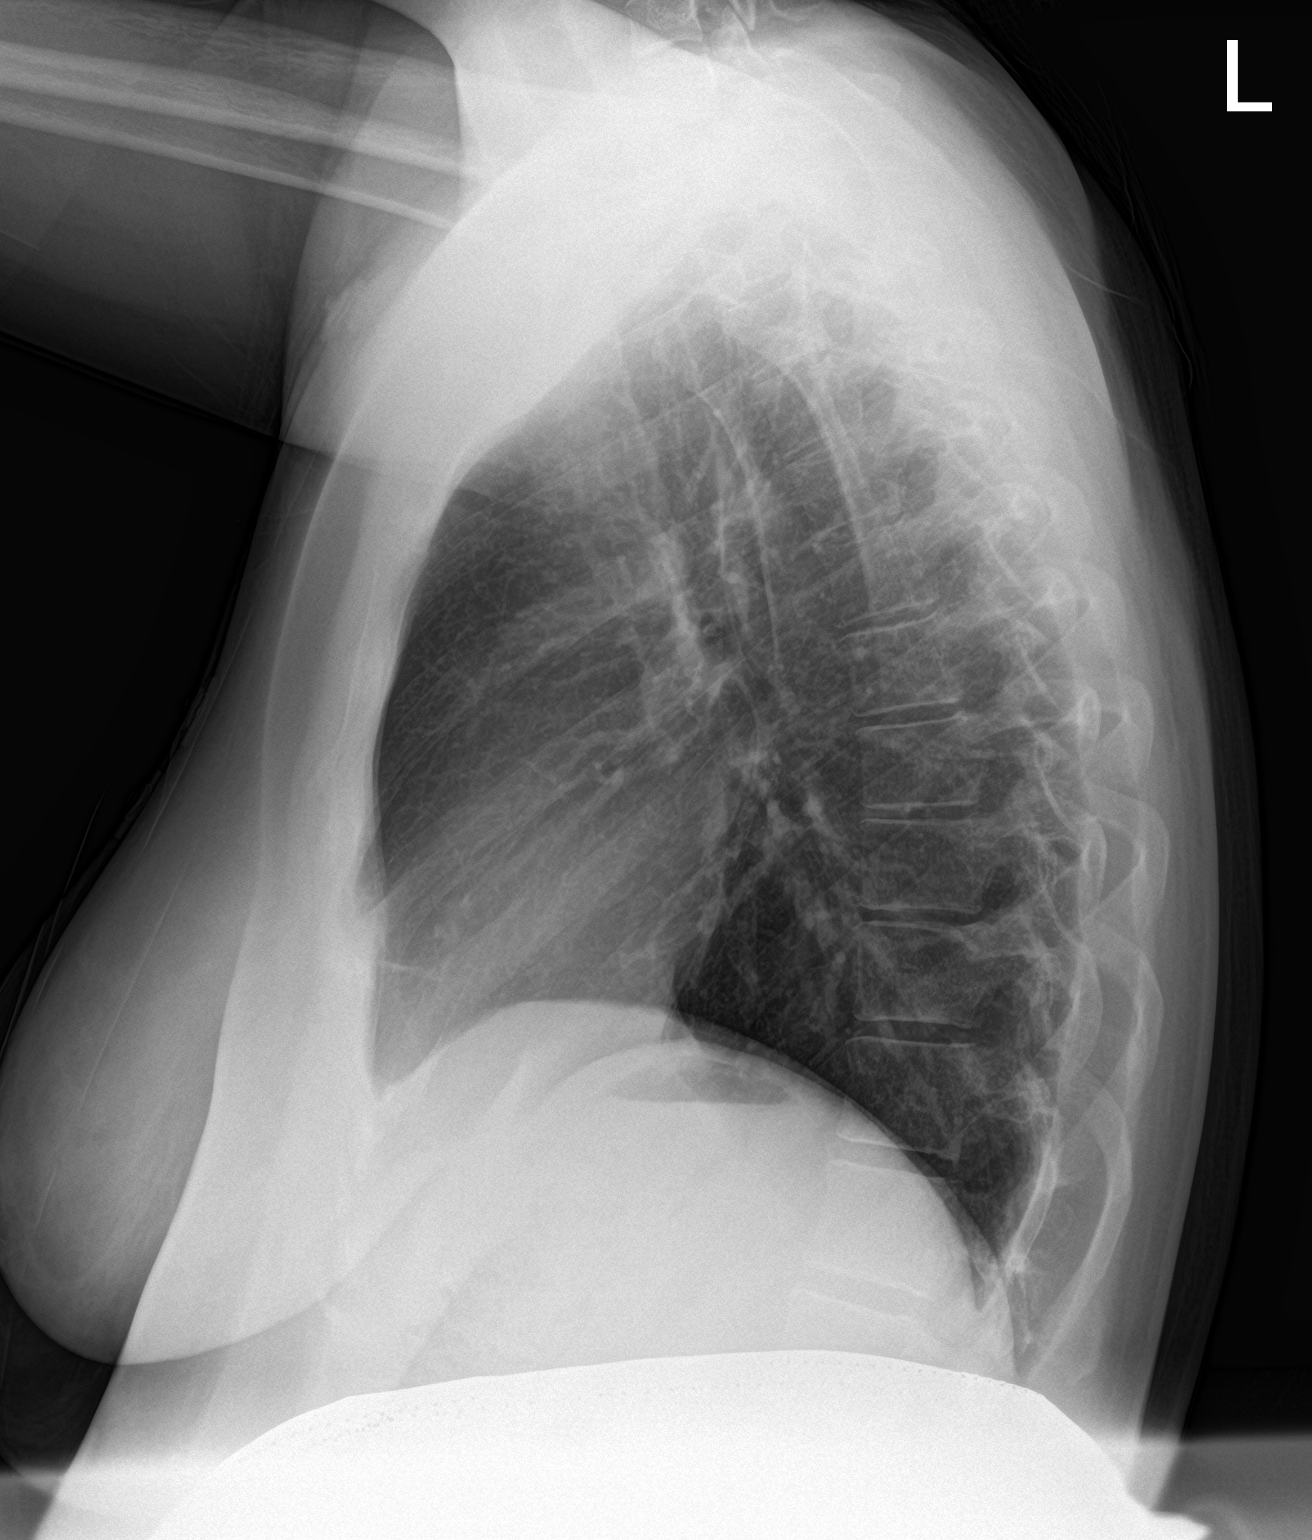

[2 of 2 positions shown; findings below may reference images not displayed]

FINDINGS: The lungs are clear without focal pneumonia, edema, pneumothorax or
pleural effusion. The cardiopericardial silhouette is within normal
limits for size. Nodular density/densities projecting over the lungs
are compatible with pads for telemetry leads.
IMPRESSION: No active cardiopulmonary disease.

## 2022-10-09 ENCOUNTER — Inpatient Hospital Stay (HOSPITAL_COMMUNITY)
Admission: AD | Admit: 2022-10-09 | Discharge: 2022-10-09 | Disposition: A | Discharge status: Home / Self Care | Payer: Medicaid Other | Attending: Obstetrics and Gynecology | Admitting: Obstetrics and Gynecology

## 2022-10-09 ENCOUNTER — Encounter (HOSPITAL_COMMUNITY): Payer: Self-pay | Admitting: Obstetrics and Gynecology

## 2022-10-09 DIAGNOSIS — O479 False labor, unspecified: Secondary | ICD-10-CM

## 2022-10-09 DIAGNOSIS — O471 False labor at or after 37 completed weeks of gestation: Secondary | ICD-10-CM | POA: Diagnosis not present

## 2022-10-09 DIAGNOSIS — Z3A37 37 weeks gestation of pregnancy: Secondary | ICD-10-CM

## 2022-10-09 DIAGNOSIS — O2243 Hemorrhoids in pregnancy, third trimester: Secondary | ICD-10-CM | POA: Diagnosis not present

## 2022-10-09 DIAGNOSIS — K640 First degree hemorrhoids: Secondary | ICD-10-CM | POA: Diagnosis not present

## 2022-10-09 MED ORDER — HYDROCORTISONE ACETATE 25 MG RE SUPP
25.0000 mg | Freq: Once | RECTAL | Status: AC
  Administered 2022-10-09: 25 mg via RECTAL
  Filled 2022-10-09: qty 1

## 2022-10-09 MED ORDER — HYDROCORTISONE (PERIANAL) 2.5 % EX CREA: 1.0000 | TOPICAL_CREAM | Freq: Two times a day (BID) | CUTANEOUS | 0 refills | Status: DC

## 2022-10-09 NOTE — MAU Note (Addendum)
.  Jill Conley is a 32 y.o. at [redacted]w[redacted]d here in MAU reporting hemorrhoid pain since this am. Has had hemorrhoid issues since first pregnancy but they go away after she delivers.She tried to push the hemorrhoid back in but it comes back out.  Using Tucks for the pain. Reports good FM and denies LOF or VB. Occ ctxs but here for hemorrhoid pain  Onset of complaint: This am Pain score: 6 Vitals:   10/09/22 2023 10/09/22 2027  BP:  111/65  Pulse: 70   Resp: 17   Temp: 98.4 F (36.9 C)   SpO2: 99%      FHT:134 Lab orders placed from triage:  none

## 2022-10-09 NOTE — MAU Provider Note (Signed)
History     CSN: [id number]  Arrival date and time: 10/09/22 [id number]   Event Date/Time   First Provider Initiated Contact with Patient 10/09/22 2118      Chief Complaint  Patient presents with   Contractions   Hemorrhoids   Jill Conley is a 32 y.o. [id number] at 60w4dwho presents today with contractions, but of more concern she has a hemorrhoid. She states that she has been having contractions off and on for some time and they are always worse at night. She reports that the hemorrhoid started to bother her today around 12:00. She has been using tucks, but that is not helping. She also tried to push the hemorrhoid back in but it would not stay. She denies any VB or LOF. She reports normal fetal movement.    OB History     Gravida  4   Para  3   Term  1   Preterm  2   AB      Living  3      SAB      IAB      Ectopic      Multiple  0   Live Births  3           Past Medical History:  Diagnosis Date   Medical history non-contributory     Past Surgical History:  Procedure Laterality Date   BARTHOLIN CYST MARSUPIALIZATION N/A 08/31/2021   Procedure: Incision and Drainage of Bartholins cyst;  Surgeon: HBobbye Charleston MD;  [place]  Service: Gynecology;  Laterality: N/A;   HEMORRHOID SURGERY     NO PAST SURGERIES     WISDOM TOOTH EXTRACTION  2020    Family History  Problem Relation Age of Onset   Diabetes Neg Hx    Hypertension Neg Hx     Social History   Tobacco Use   Smoking status: Never  Vaping Use   Vaping Use: Never used  Substance Use Topics   Alcohol use: Never   Drug use: Never    Allergies: No Known Allergies  Medications Prior to Admission  Medication Sig Dispense Refill Last Dose   Prenatal Vit-Fe Fumarate-FA (PRENATAL VITAMIN PO) Take 1 tablet by mouth daily.   Past Week   acetaminophen (TYLENOL) 500 MG tablet Take 500 mg by mouth every 6 (six) hours as needed for moderate pain.      NIFEdipine (PROCARDIA) 10 MG capsule  Take 1 capsule (10 mg total) by mouth every 4 (four) hours as needed (contractions). 30 capsule 0 More than a month    Review of Systems  All other systems reviewed and are negative.  Physical Exam   Blood pressure 111/65, pulse 70, temperature 98.4 F (36.9 C), resp. rate 17, height 5' 3"$  (1.6 m), weight 78.5 kg, last menstrual period 01/11/2022, SpO2 99 %, currently breastfeeding.  Physical Exam Constitutional:      Appearance: She is well-developed.  HENT:     Head: Normocephalic.  Eyes:     Pupils: Pupils are equal, round, and reactive to light.  Cardiovascular:     Rate and Rhythm: Normal rate.  Pulmonary:     Effort: Pulmonary effort is normal. No respiratory distress.  Abdominal:     Palpations: Abdomen is soft.     Tenderness: There is no abdominal tenderness.  Genitourinary:    Vagina: No bleeding. Vaginal discharge: mucusy.    Comments: External: no lesion Vagina: small amount of white discharge Dilation: 4 Effacement (%): 80  Cervical Position: Posterior Station: -2 Presentation: Vertex Exam by:: H.Pippa Hanif,CNM Small external hemorrhoid seen on exam today    Musculoskeletal:        General: Normal range of motion.     Cervical back: Normal range of motion.  Skin:    General: Skin is warm and dry.  Neurological:     Mental Status: She is alert and oriented to person, place, and time.  Psychiatric:        Mood and Affect: Mood normal.        Behavior: Behavior normal.   NST:  Baseline: 135 Variability: moderate Accels: 15x15 Decels: none Toco: q3-4 mins Reactive/Appropriate for GA   MAU Course  Procedures  MDM Anusol suppository given  10:42PM contractions are spacing on the monitor. On recheck patient is unchanged. She reports that the suppository has helped with the discomfort form the hemorrhoid.   Assessment and Plan   1. Braxton Hicks contractions   2. [redacted] weeks gestation of pregnancy   3. Grade I hemorrhoids    DC home in stable  condition  3rd Trimester precautions  labor precautions  Fetal kick counts RX: Anusol 2.5% cream BID  Return to MAU as needed FU with OB as planned   Follow-up Information     Ob/Gyn, [facility] Follow up.   Contact information: [place] [address] [place] DNP, CNM  10/09/22  10:45 PM

## 2022-10-12 ENCOUNTER — Inpatient Hospital Stay (HOSPITAL_COMMUNITY)
Admission: AD | Admit: 2022-10-12 | Discharge: 2022-10-13 | DRG: 807 | Disposition: A | Discharge status: Home / Self Care | Payer: Medicaid Other | Attending: Obstetrics and Gynecology | Admitting: Obstetrics and Gynecology

## 2022-10-12 ENCOUNTER — Encounter (HOSPITAL_COMMUNITY): Payer: Self-pay | Admitting: Obstetrics and Gynecology

## 2022-10-12 DIAGNOSIS — K5900 Constipation, unspecified: Secondary | ICD-10-CM | POA: Diagnosis not present

## 2022-10-12 DIAGNOSIS — Z3A39 39 weeks gestation of pregnancy: Secondary | ICD-10-CM | POA: Diagnosis not present

## 2022-10-12 DIAGNOSIS — O872 Hemorrhoids in the puerperium: Secondary | ICD-10-CM | POA: Diagnosis present

## 2022-10-12 DIAGNOSIS — Z3490 Encounter for supervision of normal pregnancy, unspecified, unspecified trimester: Principal | ICD-10-CM

## 2022-10-12 DIAGNOSIS — Z3A38 38 weeks gestation of pregnancy: Secondary | ICD-10-CM | POA: Diagnosis not present

## 2022-10-12 DIAGNOSIS — O99824 Streptococcus B carrier state complicating childbirth: Secondary | ICD-10-CM | POA: Diagnosis not present

## 2022-10-12 DIAGNOSIS — R030 Elevated blood-pressure reading, without diagnosis of hypertension: Secondary | ICD-10-CM | POA: Diagnosis not present

## 2022-10-12 DIAGNOSIS — O26893 Other specified pregnancy related conditions, third trimester: Secondary | ICD-10-CM | POA: Diagnosis not present

## 2022-10-12 DIAGNOSIS — R102 Pelvic and perineal pain: Secondary | ICD-10-CM | POA: Diagnosis not present

## 2022-10-12 LAB — CBC
HCT: 35.1 % — ABNORMAL LOW (ref 36.0–46.0)
Hemoglobin: 11.4 g/dL — ABNORMAL LOW (ref 12.0–15.0)
MCH: 29.3 pg (ref 26.0–34.0)
MCHC: 32.5 g/dL (ref 30.0–36.0)
MCV: 90.2 fL (ref 80.0–100.0)
Platelets: 270 10*3/uL (ref 150–400)
RBC: 3.89 MIL/uL (ref 3.87–5.11)
RDW: 13.1 % (ref 11.5–15.5)
WBC: 16.2 10*3/uL — ABNORMAL HIGH (ref 4.0–10.5)
nRBC: 0 % (ref 0.0–0.2)

## 2022-10-12 MED ORDER — DIBUCAINE (PERIANAL) 1 % EX OINT: 1.0000 | TOPICAL_OINTMENT | CUTANEOUS | Status: DC | PRN

## 2022-10-12 MED ORDER — WITCH HAZEL-GLYCERIN EX PADS: 1.0000 | MEDICATED_PAD | CUTANEOUS | Status: DC | PRN

## 2022-10-12 MED ORDER — COCONUT OIL OIL: 1.0000 | TOPICAL_OIL | Status: DC | PRN

## 2022-10-12 MED ORDER — ONDANSETRON HCL 4 MG PO TABS: 4.0000 mg | ORAL_TABLET | ORAL | Status: DC | PRN

## 2022-10-12 MED ORDER — ONDANSETRON HCL 4 MG/2ML IJ SOLN: 4.0000 mg | INTRAMUSCULAR | Status: DC | PRN

## 2022-10-12 MED ORDER — PRENATAL MULTIVITAMIN CH
1.0000 | ORAL_TABLET | Freq: Every day | ORAL | Status: DC
  Administered 2022-10-12 – 2022-10-13 (×2): 1 via ORAL
  Filled 2022-10-12 (×2): qty 1

## 2022-10-12 MED ORDER — DIPHENHYDRAMINE HCL 25 MG PO CAPS: 25.0000 mg | ORAL_CAPSULE | Freq: Four times a day (QID) | ORAL | Status: DC | PRN

## 2022-10-12 MED ORDER — OXYTOCIN 10 UNIT/ML IJ SOLN
INTRAMUSCULAR | Status: AC
  Administered 2022-10-12: 10 [IU] via INTRAMUSCULAR
  Filled 2022-10-12: qty 1

## 2022-10-12 MED ORDER — OXYCODONE HCL 5 MG PO TABS: 5.0000 mg | ORAL_TABLET | ORAL | Status: DC | PRN

## 2022-10-12 MED ORDER — IBUPROFEN 600 MG PO TABS
600.0000 mg | ORAL_TABLET | Freq: Four times a day (QID) | ORAL | Status: DC
  Administered 2022-10-12 – 2022-10-13 (×6): 600 mg via ORAL
  Filled 2022-10-12 (×6): qty 1

## 2022-10-12 MED ORDER — OXYCODONE HCL 5 MG PO TABS: 10.0000 mg | ORAL_TABLET | ORAL | Status: DC | PRN

## 2022-10-12 MED ORDER — TETANUS-DIPHTH-ACELL PERTUSSIS 5-2.5-18.5 LF-MCG/0.5 IM SUSY: 0.5000 mL | PREFILLED_SYRINGE | Freq: Once | INTRAMUSCULAR | Status: DC

## 2022-10-12 MED ORDER — ZOLPIDEM TARTRATE 5 MG PO TABS: 5.0000 mg | ORAL_TABLET | Freq: Every evening | ORAL | Status: DC | PRN

## 2022-10-12 MED ORDER — SENNOSIDES-DOCUSATE SODIUM 8.6-50 MG PO TABS
2.0000 | ORAL_TABLET | Freq: Every day | ORAL | Status: DC
  Administered 2022-10-13: 2 via ORAL
  Filled 2022-10-12: qty 2

## 2022-10-12 MED ORDER — FENTANYL CITRATE (PF) 100 MCG/2ML IJ SOLN
100.0000 ug | Freq: Once | INTRAMUSCULAR | Status: AC
  Administered 2022-10-12: 100 ug via INTRAMUSCULAR

## 2022-10-12 MED ORDER — FENTANYL CITRATE (PF) 100 MCG/2ML IJ SOLN
INTRAMUSCULAR | Status: AC
  Filled 2022-10-12: qty 2

## 2022-10-12 MED ORDER — BENZOCAINE-MENTHOL 20-0.5 % EX AERO
1.0000 | INHALATION_SPRAY | CUTANEOUS | Status: DC | PRN
  Administered 2022-10-12: 1 via TOPICAL
  Filled 2022-10-12: qty 56

## 2022-10-12 MED ORDER — SIMETHICONE 80 MG PO CHEW: 80.0000 mg | CHEWABLE_TABLET | ORAL | Status: DC | PRN

## 2022-10-12 MED ORDER — ACETAMINOPHEN 325 MG PO TABS
650.0000 mg | ORAL_TABLET | ORAL | Status: DC | PRN
  Administered 2022-10-12 (×2): 650 mg via ORAL
  Filled 2022-10-12 (×2): qty 2

## 2022-10-12 NOTE — Lactation Note (Signed)
This note was copied from a baby's chart. Lactation Consultation Note  Patient Name: Jill Conley Today's Date: 10/12/2022 Reason for consult: Initial assessment;Early term 37-38.6wks;Breastfeeding assistance Age:32 hours  LC attempted to use the iPad.  The Father of the infant preferred to translate. He stated that he signed a paper to be able to translate for the birth parent.  LC entered the room and the infant was asleep in the bassinet.  Per the FOB, the infant recently had 10-9m of [facility]  The FOB stated that the birth parent attempted to breastfeed their 3 other children, but the birth parent stopped producing milk after they left the hospital.  The FOB stated that with each birth, the birth parent tended to produce less milk.  He stated that there was no known hormonal issue and the birth parent was set up with a DEBP in the hospital each time.  The birth parent was tired and preferred not to be set up with the pump until later.  LC reviewed the outpatient services brochure and encouraged the birth parent to call for lactation assistance when the infant is ready to feed.  The parents agreed to be set up with a DEBP during the next lactation visit.   Infant Feeding Plan:  Breastfeed 8+ times per day according to feeding cues.  Hand express and feed express milk to the infant via a spoon.  Put the infant to the breast prior to supplementing with a bottle.  Supplement according to supplementation guidelines. Call [facility]for assistance with breastfeeding.  Maternal Data Has patient been taught Hand Expression?: Yes Does the patient have breastfeeding experience prior to this delivery?: Yes How long did the patient breastfeed?: 1 Week with each child  Feeding Mother's Current Feeding Choice: Breast Milk and Formula  LATCH Score Latch: Grasps breast easily, tongue down, lips flanged, rhythmical sucking.  Audible Swallowing: A few with  stimulation  Type of Nipple: Everted at rest and after stimulation  Comfort (Breast/Nipple): Soft / non-tender  Hold (Positioning): No assistance needed to correctly position infant at breast.  LATCH Score: 9  Lactation Tools Discussed/Used   Interventions Interventions: [facility] brochure  Discharge Pump: DEBP;Personal  Consult Status Consult Status: Follow-up Date: 10/12/22 Follow-up type: In-patient   SElly ModenaBizzell 10/12/2022, 8:20 AM

## 2022-10-12 NOTE — H&P (Addendum)
Jill Conley is a 54 y.[id number] female presenting in active labor. Pt was noted to be 8cm dilated in MAU and progressed quickly on arrival to L/D I was called and notified while pt in transport to L/D. Pt delivered shortly afterwards  GBS positive.  OB History     Gravida  4   Para  3   Term  1   Preterm  2   AB      Living  3      SAB      IAB      Ectopic      Multiple  0   Live Births  3          Past Medical History:  Diagnosis Date   Medical history non-contributory    Past Surgical History:  Procedure Laterality Date   BARTHOLIN CYST MARSUPIALIZATION N/A 08/31/2021   Procedure: Incision and Drainage of Bartholins cyst;  Surgeon: Bobbye Charleston, MD;  Location: [facility];  Service: Gynecology;  Laterality: N/A;   HEMORRHOID SURGERY     NO PAST SURGERIES     WISDOM TOOTH EXTRACTION  2020   Family History: family history is not on file. Social History:  reports that she has never smoked. She does not have any smokeless tobacco history on file. She reports that she does not drink alcohol and does not use drugs.     Maternal Diabetes: No Genetic Screening: Normal Maternal Ultrasounds/Referrals: Normal Fetal Ultrasounds or other Referrals:  None Maternal Substance Abuse:  No Significant Maternal Medications:  None Significant Maternal Lab Results:  Group B Strep positive Number of Prenatal Visits:greater than 3 verified prenatal visits Other Comments:  None  Review of Systems  Constitutional:  Positive for activity change and fatigue.  Respiratory:  Positive for shortness of breath.   Gastrointestinal:  Positive for abdominal pain.  Genitourinary:  Positive for pelvic pain.  Musculoskeletal:  Positive for back pain.  Psychiatric/Behavioral:  The patient is nervous/anxious.    Maternal Medical History:  Reason for admission: Contractions.   Contractions: Onset was 3-5 hours ago.   Frequency: regular.   Fetal activity: Perceived fetal activity is  normal.   Prenatal complications: Preterm labor.   Prenatal Complications - Diabetes: none.   Dilation: 10 Effacement (%): 100 Station: -1 Exam by:: Dr. Janus Molder Blood pressure 118/68, pulse (!) 139, last menstrual period 01/11/2022, currently breastfeeding. Maternal Exam:  Uterine Assessment: Contraction strength is moderate.  Contraction frequency is regular.  Abdomen: Patient reports generalized tenderness.  Estimated fetal weight is AGA.   Fetal presentation: vertex Introitus: Normal vulva. Vulva is negative for condylomata.  Normal vagina.  Vagina is negative for condylomata.  Pelvis: adequate for delivery.   Cervix: Cervix evaluated by digital exam.     Fetal Exam Fetal Monitor Review: Baseline rate: 135.  Variability: moderate (6-25 bpm).   Pattern: accelerations present and no decelerations.   Fetal State Assessment: Category I - tracings are normal.   Physical Exam Vitals and nursing note reviewed. Exam conducted with a chaperone present.  Constitutional:      Appearance: Normal appearance.  Cardiovascular:     Pulses: Normal pulses.  Abdominal:     Tenderness: There is generalized abdominal tenderness.  Genitourinary:    General: Normal vulva.  Musculoskeletal:        General: Normal range of motion.     Cervical back: Normal range of motion.  Skin:    General: Skin is warm.     Capillary Refill: Capillary  refill takes 2 to 3 seconds.  Neurological:     Mental Status: She is alert.     Prenatal labs: ABO, Rh:   Antibody:   Rubella:   RPR:    HBsAg:    HIV:    GBS:     Assessment/Plan: [id number] female presenting in active labor, AROM - clear - Admit - Anticipate imminent delivery - GBS positive    Jill Conley Jill Conley 10/12/2022, 4:09 AM

## 2022-10-12 NOTE — MAU Note (Signed)
MAU registration called stating pt has the urge to push. Pt brought straight back to MAU exam room  0316 EFM placed - FHR 135-140  0318 SVE 8 / 73 / -1 with BBOW  Dr. Terri Piedra notified along with L&D charge RN.   0321 EFM removed - FHR 137; pt transported to L&D by M. Finley Chevez, RN and Dr. Janus Molder.

## 2022-10-13 ENCOUNTER — Encounter (HOSPITAL_COMMUNITY): Payer: Self-pay | Admitting: Obstetrics and Gynecology

## 2022-10-13 ENCOUNTER — Inpatient Hospital Stay (HOSPITAL_COMMUNITY)
Admission: AD | Admit: 2022-10-13 | Discharge: 2022-10-14 | Disposition: A | Discharge status: Home / Self Care | Payer: Medicaid Other

## 2022-10-13 ENCOUNTER — Other Ambulatory Visit: Payer: Self-pay

## 2022-10-13 DIAGNOSIS — O872 Hemorrhoids in the puerperium: Secondary | ICD-10-CM | POA: Diagnosis not present

## 2022-10-13 DIAGNOSIS — R030 Elevated blood-pressure reading, without diagnosis of hypertension: Secondary | ICD-10-CM | POA: Diagnosis not present

## 2022-10-13 DIAGNOSIS — K5900 Constipation, unspecified: Secondary | ICD-10-CM | POA: Diagnosis not present

## 2022-10-13 DIAGNOSIS — R102 Pelvic and perineal pain: Secondary | ICD-10-CM | POA: Diagnosis not present

## 2022-10-13 LAB — COMPREHENSIVE METABOLIC PANEL
ALT: 13 U/L (ref 0–44)
AST: 26 U/L (ref 15–41)
Albumin: 2.8 g/dL — ABNORMAL LOW (ref 3.5–5.0)
Alkaline Phosphatase: 137 U/L — ABNORMAL HIGH (ref 38–126)
Anion gap: 8 (ref 5–15)
BUN: 10 mg/dL (ref 6–20)
CO2: 25 mmol/L (ref 22–32)
Calcium: 9 mg/dL (ref 8.9–10.3)
Chloride: 102 mmol/L (ref 98–111)
Creatinine, Ser: 0.64 mg/dL (ref 0.44–1.00)
GFR, Estimated: >60 mL/min (ref >60)
Glucose, Bld: 83 mg/dL (ref 70–99)
Potassium: 5.2 mmol/L — ABNORMAL HIGH (ref 3.5–5.1)
Sodium: 135 mmol/L (ref 135–145)
Total Bilirubin: 0.2 mg/dL — ABNORMAL LOW (ref 0.3–1.2)
Total Protein: 6.5 g/dL (ref 6.5–8.1)

## 2022-10-13 LAB — CBC
HCT: 35.5 % — ABNORMAL LOW (ref 36.0–46.0)
Hemoglobin: 11.9 g/dL — ABNORMAL LOW (ref 12.0–15.0)
MCH: 30.3 pg (ref 26.0–34.0)
MCHC: 33.5 g/dL (ref 30.0–36.0)
MCV: 90.3 fL (ref 80.0–100.0)
Platelets: 266 10*3/uL (ref 150–400)
RBC: 3.93 MIL/uL (ref 3.87–5.11)
RDW: 13.1 % (ref 11.5–15.5)
WBC: 10.9 10*3/uL — ABNORMAL HIGH (ref 4.0–10.5)
nRBC: 0 % (ref 0.0–0.2)

## 2022-10-13 MED ORDER — IBUPROFEN 800 MG PO TABS
800.0000 mg | ORAL_TABLET | Freq: Once | ORAL | Status: AC
  Administered 2022-10-13: 800 mg via ORAL

## 2022-10-13 MED ORDER — IBUPROFEN 800 MG PO TABS
ORAL_TABLET | ORAL | Status: AC
  Filled 2022-10-13: qty 1

## 2022-10-13 MED ORDER — DOCUSATE SODIUM 100 MG PO CAPS: 100.0000 mg | ORAL_CAPSULE | Freq: Two times a day (BID) | ORAL | 1 refills | Status: AC

## 2022-10-13 MED ORDER — OXYCODONE HCL 5 MG PO TABS
5.0000 mg | ORAL_TABLET | Freq: Once | ORAL | Status: AC
  Administered 2022-10-13: 5 mg via ORAL
  Filled 2022-10-13: qty 1

## 2022-10-13 MED ORDER — ACETAMINOPHEN 325 MG PO TABS
ORAL_TABLET | ORAL | Status: AC
  Filled 2022-10-13: qty 2

## 2022-10-13 MED ORDER — HYDROCORTISONE ACETATE 25 MG RE SUPP: 25.0000 mg | Freq: Two times a day (BID) | RECTAL | 1 refills | Status: AC

## 2022-10-13 MED ORDER — HYDROCORTISONE ACETATE 25 MG RE SUPP
25.0000 mg | Freq: Two times a day (BID) | RECTAL | Status: DC
  Administered 2022-10-13: 25 mg via RECTAL
  Filled 2022-10-13: qty 1

## 2022-10-13 MED ORDER — ACETAMINOPHEN 325 MG PO TABS
650.0000 mg | ORAL_TABLET | Freq: Once | ORAL | Status: AC
  Administered 2022-10-13: 650 mg via ORAL

## 2022-10-13 MED ORDER — IBUPROFEN 600 MG PO TABS: 600.0000 mg | ORAL_TABLET | Freq: Four times a day (QID) | ORAL | 0 refills | Status: AC | PRN

## 2022-10-13 NOTE — MAU Note (Signed)
.  Jill Conley is a 32 y.o. at Unknown here in MAU reporting: has NSVD yesterday. Was d/c'd home today. C/o vaginal swelling and discomfort and hemorrhoid pain. Just taking tylenol and tried dermaplast.. LMP:  Onset of complaint: today Pain score: 9 Vitals:   10/13/22 2119  BP: (!) 130/92  Pulse: 67  Resp: 18  Temp: 97.6 F (36.4 C)  SpO2: 100%     FHT:n/a Lab orders placed from triage:

## 2022-10-13 NOTE — Progress Notes (Signed)
Post Partum Day 1 Subjective: no complaints, up ad lib, voiding, and tolerating PO  Objective: Blood pressure 102/63, pulse 65, temperature 97.9 F (36.6 C), temperature source Oral, resp. rate 18, last menstrual period 01/11/2022, SpO2 99 %, unknown if currently breastfeeding.  Physical Exam:  General: alert, cooperative, and appears stated age 32: appropriate Uterine Fundus: firm DVT Evaluation: No evidence of DVT seen on physical exam.  Recent Labs    10/12/22 0642  HGB 11.4*  HCT 35.1*    Assessment/Plan: Breastfeeding Routine PP care Pt GBS + with incomplete treatment prior to delivery, therefore baby will need to stay for 48 hours   LOS: 1 day   Vanessa Kick, MD 10/13/2022, 12:53 PM

## 2022-10-13 NOTE — MAU Provider Note (Signed)
History     CSN: [id number]  Arrival date and time: 10/13/22 2112   Event Date/Time   First Provider Initiated Contact with Patient 10/13/22 2224      Chief Complaint  Patient presents with   Vaginal Swelling   Jill Conley is a 32 y.o. [id number] at 1 Day Postpartum who receives care at [facility].  She presents today for hemorrhoid and vaginal pain/swelling.  Patient husband, Costella Hatcher, states this has been an ongoing issue and she experienced vaginal swelling with I&D after previous pregnancy.  He states patient was put to sleep for the procedure. Review of chart shows that initial I&D was completed in MAU, but became infected resulting in surgical intervention under anesthesia.  Patient husband also reports patient with hemorrhoids that have been present t/o the pregnancy.  He states that patient received suppository last week that helped, but then was sent cream to the pharmacy that was not relieving.  Patient denies bowel movements and reports pain and burning with urination.  Patient husband states that patient would like to have I&D if necessary, under general anesthesia.    OB History     Gravida  4   Para  4   Term  2   Preterm  2   AB      Living  4      SAB      IAB      Ectopic      Multiple  0   Live Births  4           Past Medical History:  Diagnosis Date   Medical history non-contributory     Past Surgical History:  Procedure Laterality Date   BARTHOLIN CYST MARSUPIALIZATION N/A 08/31/2021   Procedure: Incision and Drainage of Bartholins cyst;  Surgeon: Bobbye Charleston, MD;  Location: [facility];  Service: Gynecology;  Laterality: N/A;   HEMORRHOID SURGERY     NO PAST SURGERIES     WISDOM TOOTH EXTRACTION  2020    Family History  Problem Relation Age of Onset   Diabetes Neg Hx    Hypertension Neg Hx     Social History   Tobacco Use   Smoking status: Never  Vaping Use   Vaping Use: Never used  Substance Use Topics   Alcohol  use: Never   Drug use: Never    Allergies: No Known Allergies  Medications Prior to Admission  Medication Sig Dispense Refill Last Dose   acetaminophen (TYLENOL) 650 MG CR tablet Take 650 mg by mouth every 8 (eight) hours as needed for pain.   10/13/2022 at 1830   ibuprofen (ADVIL) 600 MG tablet Take 1 tablet (600 mg total) by mouth every 6 (six) hours as needed. 90 tablet 0 Unknown    Review of Systems  Gastrointestinal:  Positive for constipation. Negative for abdominal pain, diarrhea, nausea and vomiting.  Genitourinary:  Positive for dysuria and vaginal bleeding. Negative for difficulty urinating.   Physical Exam   Blood pressure 130/78, pulse (!) 57, temperature 98 F (36.7 C), temperature source Oral, resp. rate 20, SpO2 99 %, unknown if currently breastfeeding.  Physical Exam Vitals reviewed. Exam conducted with a chaperone present.  Constitutional:      Appearance: Normal appearance.  HENT:     Head: Normocephalic and atraumatic.  Eyes:     Conjunctiva/sclera: Conjunctivae normal.  Cardiovascular:     Rate and Rhythm: Normal rate.  Pulmonary:     Effort: Pulmonary effort is normal. No  respiratory distress.  Genitourinary:    Labia:        Right: No rash or tenderness.        Left: No rash or tenderness.      Rectum: Tenderness (At area of hemorrhoid) and external hemorrhoid present.     Comments: Visual/Bimanual Exam Performed.  No apparent edema or erythema noted externally.  Digital exam reveals no signs of batholin cyst or abnormal edema.  Appropriately tender.  Hemorrhoid noted: Not strangulated and without edema.  Musculoskeletal:     Cervical back: Normal range of motion.  Skin:    General: Skin is warm and dry.  Neurological:     Mental Status: She is alert and oriented to person, place, and time.  Psychiatric:        Mood and Affect: Mood normal.        Behavior: Behavior normal.     MAU Course  Procedures Results for orders placed or performed  during the hospital encounter of 10/13/22 (from the past 24 hour(s))  CBC     Status: Abnormal   Collection Time: 10/13/22 10:47 PM  Result Value Ref Range   WBC 10.9 (H) 4.0 - 10.5 K/uL   RBC 3.93 3.87 - 5.11 MIL/uL   Hemoglobin 11.9 (L) 12.0 - 15.0 g/dL   HCT 35.5 (L) 36.0 - 46.0 %   MCV 90.3 80.0 - 100.0 fL   MCH 30.3 26.0 - 34.0 pg   MCHC 33.5 30.0 - 36.0 g/dL   RDW 13.1 11.5 - 15.5 %   Platelets 266 150 - 400 K/uL   nRBC 0.0 0.0 - 0.2 %  Comprehensive metabolic panel     Status: Abnormal   Collection Time: 10/13/22 10:47 PM  Result Value Ref Range   Sodium 135 135 - 145 mmol/L   Potassium 5.2 (H) 3.5 - 5.1 mmol/L   Chloride 102 98 - 111 mmol/L   CO2 25 22 - 32 mmol/L   Glucose, Bld 83 70 - 99 mg/dL   BUN 10 6 - 20 mg/dL   Creatinine, Ser 0.64 0.44 - 1.00 mg/dL   Calcium 9.0 8.9 - 10.3 mg/dL   Total Protein 6.5 6.5 - 8.1 g/dL   Albumin 2.8 (L) 3.5 - 5.0 g/dL   AST 26 15 - 41 U/L   ALT 13 0 - 44 U/L   Alkaline Phosphatase 137 (H) 38 - 126 U/L   Total Bilirubin 0.2 (L) 0.3 - 1.2 mg/dL   GFR, Estimated >60 >60 mL/min   Anion gap 8 5 - 15    MDM Pain medication Suppository Exam Prescription Assessment and Plan  32 year old  Postpartum State s/p SVD Vaginal Pain Hemorrhoids Elevated BP Language Barrier  -POC Reviewed.  -Patient requests pain medication (tylenol and ibuprofen). Order placed. -Informed of elevated bp and will complete labs. Patient agreeable. -Attempted exam, but patient uncomfortable. Will also give oxycodone to promote comfort.  -Patient reports previous visit with good results, for hemorrhoid, with suppository.  Will order and place prior to exam. -Labs ordered. -Husband acting as interpreter per patient preference.   Maryann Conners 10/13/2022, 10:25 PM   Reassessment (11:45 PM) -Provider returns to complete assessment. -BP remain normotensive. Labs without significant findings.  -Patient s/p suppository and oxycodone. Reports  comfort. -Exam performed and findings discussed. -Reassured no bartholin cyst noted and all findings c/w recent vaginal delivery.  -Discussed home management of hemorrhoids. Will give supplies for sitz bath.  -Rx for suppository and colace sent  to pharmacy on file.  -Referral for general surgery placed.  -Precautions reviewed. -Encouraged to call primary office or return to MAU if symptoms worsen or with the onset of new symptoms. -Discharged to home in stable condition.  Maryann Conners MSN, CNM Advanced Practice Provider, [facility]

## 2022-10-13 NOTE — ED Notes (Signed)
MAU RN notified on patient's transfer.

## 2022-10-13 NOTE — ED Triage Notes (Signed)
Patient reports vaginal swelling this evening , S/P vaginal delivery yesterday at [facility] .

## 2022-10-13 NOTE — Discharge Summary (Signed)
Postpartum Discharge Summary       Patient Name: Jill Conley DOB: 21-May-1991 MRN: [id number]  Date of admission: 10/12/2022 Delivery date:10/12/2022  Delivering provider: Carlynn Purl [facility]  Date of discharge: 10/13/2022  Admitting diagnosis: Term pregnancy [Z34.90] Intrauterine pregnancy: [redacted]w[redacted]d    Secondary diagnosis:  Principal Problem:   Term pregnancy     Discharge diagnosis: Term Pregnancy Delivered                                              Post partum procedures: NA Augmentation: N/A Complications: None  Hospital course: Onset of labor With Vaginal Delivery   32y.o. yo [id number] admitted to the hospital 10/12/2022 in labor.  She had a spontaneous uncomplicated vaginal delivery. Membrane Rupture Time/Date: 3:27 AM ,10/12/2022   Delivery Method:Vaginal, Spontaneous  Episiotomy: None  Lacerations:  None  Details of delivery can be found in separate delivery note.  Patient had a postpartum course complicated by Nothing. Patient is discharged home 10/13/22.  Newborn Data: Birth date:10/12/2022  Birth time:3:44 AM  Gender:Female  Living status:Living  Apgars:9 ,9  Weight:2990 g   Magnesium Sulfate received: No BMZ received: No Rhophylac:N/A  Physical exam  Vitals:   10/12/22 1418 10/12/22 1814 10/12/22 2043 10/13/22 0523  BP: 118/79 134/83 110/69 102/63  Pulse: 63 (!) 57 77 65  Resp: 20 18 18 18  $ Temp: 98 F (36.7 C) 97.9 F (36.6 C) 98.2 F (36.8 C) 97.9 F (36.6 C)  TempSrc: Oral  Oral Oral  SpO2:  99%     General: alert, cooperative, and no distress Lochia: appropriate Uterine Fundus: firm Incision: Healing well with no significant drainage DVT Evaluation: No evidence of DVT seen on physical exam. Labs: Lab Results  Component Value Date   WBC 16.2 (H) 10/12/2022   HGB 11.4 (L) 10/12/2022   HCT 35.1 (L) 10/12/2022   MCV 90.2 10/12/2022   PLT 270 10/12/2022      Latest Ref Rng & Units 07/02/2022    4:33 AM  CMP  Glucose 70  - 99 mg/dL 104   BUN 6 - 20 mg/dL 6   Creatinine 0.44 - 1.00 mg/dL 0.52   Sodium 135 - 145 mmol/L 136   Potassium 3.5 - 5.1 mmol/L 3.2   Chloride 98 - 111 mmol/L 98   CO2 22 - 32 mmol/L 24   Calcium 8.9 - 10.3 mg/dL 9.3   Total Protein 6.5 - 8.1 g/dL 7.1   Total Bilirubin 0.3 - 1.2 mg/dL 0.8   Alkaline Phos 38 - 126 U/L 86   AST 15 - 41 U/L 19   ALT 0 - 44 U/L 12    Edinburgh Score:    10/13/2022   10:02 AM  Edinburgh Postnatal Depression Scale Screening Tool  I have been able to laugh and see the funny side of things. 0  I have looked forward with enjoyment to things. 0  I have blamed myself unnecessarily when things went wrong. 0  I have been anxious or worried for no good reason. 0  I have felt scared or panicky for no good reason. 0  Things have been getting on top of me. 0  I have been so unhappy that I have had difficulty sleeping. 0  I have felt sad or miserable. 0  I have been so unhappy that  I have been crying. 0  The thought of harming myself has occurred to me. 0  Edinburgh Postnatal Depression Scale Total 0      After visit meds:  Allergies as of 10/13/2022   No Known Allergies      Medication List     STOP taking these medications    acetaminophen 500 MG tablet Commonly known as: TYLENOL   hydrocortisone 2.5 % rectal cream Commonly known as: Anusol-HC   NIFEdipine 10 MG capsule Commonly known as: Procardia   PRENATAL VITAMIN PO       TAKE these medications    ibuprofen 600 MG tablet Commonly known as: ADVIL Take 1 tablet (600 mg total) by mouth every 6 (six) hours as needed.               Discharge Care Instructions  (From admission, onward)           Start     Ordered   10/13/22 0000  Discharge wound care:       Comments: For a cesarean delivery: You may wash incision with soap and water.  Do not soak or submerge the incision for 2 weeks. Keep incision dry. You may need to keep a sanitary pad or panty liner between the  incision and your clothing for comfort and to keep the incision dry. If you note drainage, increased pain, or increased redness of the incision, then please notify your physician.   10/13/22 1411   10/13/22 0000  If the dressing is still on your incision site when you go home, remove it on the third day after your surgery date. Remove dressing if it begins to fall off, or if it is dirty or damaged before the third day.       Comments: For a cesarean delivery   10/13/22 1411             Discharge home in stable condition Infant Feeding: Breast Infant Disposition:home with mother Discharge instruction: per After Visit Summary and Postpartum booklet. Activity: Advance as tolerated. Pelvic rest for 6 weeks.  Diet: routine diet Anticipated Birth Control: Unsure Postpartum Appointment:4 weeks Future Appointments:No future appointments. Follow up Visit:  Follow-up Information     Ob/Gyn, [facility] Follow up in 4 week(s).   Why: For a routine PP appointment Contact information: [address] [place] [address] [phone]                     10/13/2022 Vanessa Kick, MD

## 2022-10-23 ENCOUNTER — Telehealth (HOSPITAL_COMMUNITY): Payer: Self-pay | Admitting: *Deleted

## 2022-10-23 NOTE — Telephone Encounter (Signed)
Left phone voicemail message.  Odis Hollingshead, RN 10-23-2022 at 4:10pm

## 2022-10-26 ENCOUNTER — Ambulatory Visit: Payer: Medicaid Other | Admitting: Surgery

## 2022-10-26 DIAGNOSIS — Z419 Encounter for procedure for purposes other than remedying health state, unspecified: Secondary | ICD-10-CM | POA: Diagnosis not present

## 2022-11-09 ENCOUNTER — Ambulatory Visit: Payer: Medicaid Other | Admitting: Surgery

## 2022-11-13 ENCOUNTER — Encounter: Payer: Self-pay | Admitting: Surgery

## 2022-11-21 ENCOUNTER — Telehealth (HOSPITAL_COMMUNITY): Payer: Self-pay

## 2022-11-21 NOTE — Telephone Encounter (Signed)
Preadmission labs

## 2022-11-26 DIAGNOSIS — Z419 Encounter for procedure for purposes other than remedying health state, unspecified: Secondary | ICD-10-CM | POA: Diagnosis not present

## 2022-12-26 DIAGNOSIS — Z419 Encounter for procedure for purposes other than remedying health state, unspecified: Secondary | ICD-10-CM | POA: Diagnosis not present

## 2022-12-30 ENCOUNTER — Emergency Department (HOSPITAL_COMMUNITY): Payer: Medicaid Other

## 2022-12-30 ENCOUNTER — Encounter (HOSPITAL_COMMUNITY): Payer: Self-pay | Admitting: Emergency Medicine

## 2022-12-30 ENCOUNTER — Emergency Department (HOSPITAL_COMMUNITY)
Admission: EM | Admit: 2022-12-30 | Discharge: 2022-12-30 | Disposition: A | Discharge status: Home / Self Care | Payer: Medicaid Other | Attending: Emergency Medicine | Admitting: Emergency Medicine

## 2022-12-30 DIAGNOSIS — R1013 Epigastric pain: Secondary | ICD-10-CM | POA: Diagnosis not present

## 2022-12-30 DIAGNOSIS — K219 Gastro-esophageal reflux disease without esophagitis: Secondary | ICD-10-CM | POA: Diagnosis not present

## 2022-12-30 DIAGNOSIS — R079 Chest pain, unspecified: Secondary | ICD-10-CM | POA: Diagnosis not present

## 2022-12-30 DIAGNOSIS — R001 Bradycardia, unspecified: Secondary | ICD-10-CM | POA: Diagnosis not present

## 2022-12-30 DIAGNOSIS — R109 Unspecified abdominal pain: Secondary | ICD-10-CM | POA: Diagnosis not present

## 2022-12-30 LAB — CBC WITH DIFFERENTIAL/PLATELET
Abs Immature Granulocytes: 0.01 10*3/uL (ref 0.00–0.07)
Basophils Absolute: 0 10*3/uL (ref 0.0–0.1)
Basophils Relative: 0 %
Eosinophils Absolute: 0.1 10*3/uL (ref 0.0–0.5)
Eosinophils Relative: 2 %
HCT: 37.9 % (ref 36.0–46.0)
Hemoglobin: 12.7 g/dL (ref 12.0–15.0)
Immature Granulocytes: 0 %
Lymphocytes Relative: 31 %
Lymphs Abs: 2.2 10*3/uL (ref 0.7–4.0)
MCH: 30.4 pg (ref 26.0–34.0)
MCHC: 33.5 g/dL (ref 30.0–36.0)
MCV: 90.7 fL (ref 80.0–100.0)
Monocytes Absolute: 0.5 10*3/uL (ref 0.1–1.0)
Monocytes Relative: 7 %
Neutro Abs: 4.3 10*3/uL (ref 1.7–7.7)
Neutrophils Relative %: 60 %
Platelets: 328 10*3/uL (ref 150–400)
RBC: 4.18 MIL/uL (ref 3.87–5.11)
RDW: 13 % (ref 11.5–15.5)
WBC: 7.1 10*3/uL (ref 4.0–10.5)
nRBC: 0 % (ref 0.0–0.2)

## 2022-12-30 LAB — URINALYSIS, ROUTINE W REFLEX MICROSCOPIC
Bilirubin Urine: NEGATIVE
Glucose, UA: NEGATIVE mg/dL
Hgb urine dipstick: NEGATIVE
Ketones, ur: NEGATIVE mg/dL
Nitrite: NEGATIVE
Protein, ur: NEGATIVE mg/dL
Specific Gravity, Urine: 1.018 (ref 1.005–1.030)
WBC, UA: >50 WBC/hpf (ref 0–5)
pH: 5 (ref 5.0–8.0)

## 2022-12-30 LAB — COMPREHENSIVE METABOLIC PANEL
ALT: 18 U/L (ref 0–44)
AST: 19 U/L (ref 15–41)
Albumin: 3.9 g/dL (ref 3.5–5.0)
Alkaline Phosphatase: 80 U/L (ref 38–126)
Anion gap: 9 (ref 5–15)
BUN: 11 mg/dL (ref 6–20)
CO2: 25 mmol/L (ref 22–32)
Calcium: 9.5 mg/dL (ref 8.9–10.3)
Chloride: 102 mmol/L (ref 98–111)
Creatinine, Ser: 0.73 mg/dL (ref 0.44–1.00)
GFR, Estimated: >60 mL/min (ref >60)
Glucose, Bld: 107 mg/dL — ABNORMAL HIGH (ref 70–99)
Potassium: 4.9 mmol/L (ref 3.5–5.1)
Sodium: 136 mmol/L (ref 135–145)
Total Bilirubin: 0.5 mg/dL (ref 0.3–1.2)
Total Protein: 7.2 g/dL (ref 6.5–8.1)

## 2022-12-30 LAB — TROPONIN I (HIGH SENSITIVITY)
Troponin I (High Sensitivity): 4 ng/L (ref <18)
Troponin I (High Sensitivity): 3 ng/L (ref <18)

## 2022-12-30 LAB — HCG, SERUM, QUALITATIVE: Preg, Serum: NEGATIVE

## 2022-12-30 LAB — LIPASE, BLOOD: Lipase: 38 U/L (ref 11–51)

## 2022-12-30 MED ORDER — IOHEXOL 350 MG/ML SOLN
75.0000 mL | Freq: Once | INTRAVENOUS | Status: AC | PRN
  Administered 2022-12-30: 75 mL via INTRAVENOUS

## 2022-12-30 MED ORDER — KETOROLAC TROMETHAMINE 15 MG/ML IJ SOLN: 15.0000 mg | Freq: Once | INTRAMUSCULAR | Status: DC

## 2022-12-30 MED ORDER — ONDANSETRON HCL 4 MG/2ML IJ SOLN: 4.0000 mg | Freq: Once | INTRAMUSCULAR | Status: DC

## 2022-12-30 MED ORDER — PANTOPRAZOLE SODIUM 20 MG PO TBEC: 40.0000 mg | DELAYED_RELEASE_TABLET | Freq: Every day | ORAL | 0 refills | Status: AC

## 2022-12-30 MED ORDER — PANTOPRAZOLE SODIUM 40 MG IV SOLR: 40.0000 mg | Freq: Once | INTRAVENOUS | Status: DC

## 2022-12-30 NOTE — Discharge Instructions (Signed)
Thank you for letting us take care of you today.  Overall, your workup was very reassuring.  We do not see issues with your heart.  The scan of your abdomen was normal.  I do suspect that your symptoms are related to reflux.  I am prescribing a medication to help with this.  Some people take it as needed the other people need to take it daily to prevent frequent symptoms.  I provided you with primary care follow-up.  Please call them to schedule a follow-up appointment within the next week.  You can discuss at that appointment if this is a medication you should continue daily, as needed, or switch to other medications or possibly be referred to a specialist such as gastroenterology for any continued symptoms.  For any new or worsening symptoms, please return to the nearest emergency department for reevaluation.

## 2022-12-30 NOTE — ED Triage Notes (Signed)
Pt here from home with c/o acid reflux since she had a baby back in feb, and also been having some frequent urination, pt has also been having some redness on her chest that comes and goes

## 2022-12-30 NOTE — ED Provider Notes (Signed)
[place] [facility]MENT AT [facility] Provider Note   CSN: [id number] Arrival date & time: 12/30/22  1504     History  No chief complaint on file.   Jill Conley is a 32 y.o. female who complains of burning epigastric pain up into the chest for the last 2 months.  She states that she was taking baking soda for it with relief but recently it has become worse and more persistent.  No previous abdominal surgeries.  No fever, chills, chest pain, shortness of breath, urinary symptoms, or other complaints.  Patient currently not on any medications.  No previous abdominal surgeries.      Home Medications Prior to Admission medications   Medication Sig Start Date End Date Taking? Authorizing Provider  pantoprazole (PROTONIX) 20 MG tablet Take 2 tablets (40 mg total) by mouth daily for 14 days. 12/30/22 01/13/23 Yes Lauriann Milillo L, PA-C  acetaminophen (TYLENOL) 650 MG CR tablet Take 650 mg by mouth every 8 (eight) hours as needed for pain.    [provider]  docusate sodium (COLACE) 100 MG capsule Take 1 capsule (100 mg total) by mouth 2 (two) times daily. 10/13/22   Gerrit Heck, CNM  hydrocortisone (ANUSOL-HC) 25 MG suppository Place 1 suppository (25 mg total) rectally 2 (two) times daily. 10/14/22   Gerrit Heck, CNM  ibuprofen (ADVIL) 600 MG tablet Take 1 tablet (600 mg total) by mouth every 6 (six) hours as needed. 10/13/22   Waynard Reeds, MD      Allergies    Patient has no known allergies.    Review of Systems   Review of Systems  All other systems reviewed and are negative.   Physical Exam Updated Vital Signs BP 106/69   Pulse 60   Temp 98.1 F (36.7 C)   Resp 16   SpO2 99%   Breastfeeding No  Physical Exam Vitals and nursing note reviewed.  Constitutional:      General: She is not in acute distress.    Appearance: Normal appearance.  HENT:     Head: Normocephalic and atraumatic.     Mouth/Throat:     Mouth: Mucous membranes are moist.   Eyes:     General: No scleral icterus.    Conjunctiva/sclera: Conjunctivae normal.  Cardiovascular:     Rate and Rhythm: Normal rate and regular rhythm.     Heart sounds: No murmur heard. Pulmonary:     Effort: Pulmonary effort is normal. No respiratory distress.     Breath sounds: Normal breath sounds. No stridor. No wheezing, rhonchi or rales.  Chest:     Chest wall: No tenderness.  Abdominal:     General: Abdomen is flat. There is no distension.     Palpations: Abdomen is soft. There is no mass.     Tenderness: There is no abdominal tenderness. There is no right CVA tenderness, left CVA tenderness, guarding or rebound.  Musculoskeletal:        General: Normal range of motion.     Cervical back: Neck supple.     Right lower leg: No edema.     Left lower leg: No edema.  Skin:    General: Skin is warm and dry.     Capillary Refill: Capillary refill takes less than 2 seconds.     Coloration: Skin is not jaundiced or pale.  Neurological:     Mental Status: She is alert. Mental status is at baseline.  Psychiatric:  Behavior: Behavior normal.     ED Results / Procedures / Treatments   Labs (all labs ordered are listed, but only abnormal results are displayed) Labs Reviewed  COMPREHENSIVE METABOLIC PANEL - Abnormal; Notable for the following components:      Result Value   Glucose, Bld 107 (*)    All other components within normal limits  URINALYSIS, ROUTINE W REFLEX MICROSCOPIC - Abnormal; Notable for the following components:   APPearance CLOUDY (*)    Leukocytes,Ua LARGE (*)    Bacteria, UA FEW (*)    Non Squamous Epithelial 0-5 (*)    All other components within normal limits  CBC WITH DIFFERENTIAL/PLATELET  LIPASE, BLOOD  HCG, SERUM, QUALITATIVE  TROPONIN I (HIGH SENSITIVITY)  TROPONIN I (HIGH SENSITIVITY)    EKG Sinus bradycardia, no acute ST-T changes, normal intervals, no STEMI  Radiology CT ABDOMEN PELVIS W CONTRAST  Result Date:  12/30/2022 CLINICAL DATA:  Abdominal pain, acute, nonlocalized. EXAM: CT ABDOMEN AND PELVIS WITH CONTRAST TECHNIQUE: Multidetector CT imaging of the abdomen and pelvis was performed using the standard protocol following bolus administration of intravenous contrast. RADIATION DOSE REDUCTION: This exam was performed according to the departmental dose-optimization program which includes automated exposure control, adjustment of the mA and/or kV according to patient size and/or use of iterative reconstruction technique. CONTRAST:  75mL OMNIPAQUE IOHEXOL 350 MG/ML SOLN COMPARISON:  None Available. FINDINGS: Lower chest: No acute abnormality. Hepatobiliary: No focal liver abnormality is seen. No gallstones, gallbladder wall thickening, or biliary dilatation. Pancreas: Unremarkable. No pancreatic ductal dilatation or surrounding inflammatory changes. Spleen: Normal. Adrenals/Urinary Tract: Adrenal glands are unremarkable. Kidneys are normal, without renal calculi, focal lesion, or hydronephrosis. Bladder is unremarkable. Stomach/Bowel: Normal stomach and duodenum. No dilated loops of small bowel. Normal appendix is visualized on axial image 64 series 3. No bowel wall thickening or surrounding inflammation. Vascular/Lymphatic: No significant vascular findings are present. No enlarged abdominal or pelvic lymph nodes. Reproductive: Uterus and bilateral adnexa are unremarkable. Other: No abdominal wall hernia or abnormality. No abdominopelvic ascites. Musculoskeletal: No acute or significant osseous findings. IMPRESSION: 1. No acute abnormality of the abdomen or pelvis. 2. Normal appendix. Electronically Signed   By: Orvan Falconer M.D.   On: 12/30/2022 19:59   DG Chest 2 View  Result Date: 12/30/2022 CLINICAL DATA:  Epigastric pain. EXAM: CHEST - 2 VIEW COMPARISON:  01/26/2021. FINDINGS: Clear lungs. Normal heart size and mediastinal contours. No pleural effusion or pneumothorax. Visualized bones and upper abdomen are  unremarkable. IMPRESSION: No evidence of acute cardiopulmonary disease. Electronically Signed   By: Orvan Falconer M.D.   On: 12/30/2022 17:15    Procedures Procedures    Medications Ordered in ED Medications  ketorolac (TORADOL) 15 MG/ML injection 15 mg (0 mg Intravenous Hold 12/30/22 1921)  pantoprazole (PROTONIX) injection 40 mg (0 mg Intravenous Hold 12/30/22 1921)  ondansetron (ZOFRAN) injection 4 mg (0 mg Intravenous Hold 12/30/22 1922)  iohexol (OMNIPAQUE) 350 MG/ML injection 75 mL (75 mLs Intravenous Contrast Given 12/30/22 1953)    ED Course/ Medical Decision Making/ A&P                             Medical Decision Making Amount and/or Complexity of Data Reviewed Labs: ordered. Decision-making details documented in ED Course. Radiology: ordered. Decision-making details documented in ED Course. ECG/medicine tests: ordered. Decision-making details documented in ED Course.  Risk Prescription drug management.   Medical Decision Making:   Jill Conley  is a 31 y.o. female who presented to the ED today with abdominal pain detailed above.    Additional history discussed with patient's family/caregivers.  Complete initial physical exam performed, notably the patient  was in no acute distress.  Abdomen soft, nontender, nondistended.  No rebound, guarding, or peritoneal signs.  No CVA tenderness.  Patient nontoxic-appearing.  Regular rate and rhythm with lungs clear to auscultation.  Neurologically intact.    Reviewed and confirmed nursing documentation for past medical history, family history, social history.    Initial Assessment:   With the patient's presentation of abdominal pain, differential diagnosis includes but is not limited to AAA, mesenteric ischemia, appendicitis, diverticulitis, DKA, gastritis, gastroenteritis, AMI, nephrolithiasis, pancreatitis, peritonitis, adrenal insufficiency, intestinal ischemia, constipation, UTI, SBO/LBO, splenic rupture, biliary disease, IBD, IBS,  PUD, hepatitis, STD, ovarian/testicular torsion, electrolyte disturbance, DKA, dehydration, acute kidney injury, renal failure, cholecystitis, cholelithiasis, choledocholithiasis, abdominal pain of  unknown etiology.   Initial Plan:  Screening labs including CBC and Metabolic panel to evaluate for infectious or metabolic etiology of disease.  Lipase to evaluate for pancreatitis Urinalysis with reflex culture ordered to evaluate for UTI or relevant urologic/nephrologic pathology.  CT abd/pelvis to evaluate for intra-abdominal pathology Chest x-ray to evaluate for intrathoracic pathology EKG to evaluate for cardiac pathology Symptomatic management, declined by patient Objective evaluation as reviewed   Initial Study Results:   Laboratory  All laboratory results reviewed without evidence of clinically relevant pathology.   Exceptions include: UA with large leukocytes, microscopic exam shows 21-50 squamous epithelial cells, suspect contamination  EKG EKG was reviewed independently. Sinus bradycardia, no acute ST-T changes, normal intervals, no STEMI  Radiology:  All images reviewed independently. Agree with radiology report at this time.   CT ABDOMEN PELVIS W CONTRAST  Result Date: 12/30/2022 CLINICAL DATA:  Abdominal pain, acute, nonlocalized. EXAM: CT ABDOMEN AND PELVIS WITH CONTRAST TECHNIQUE: Multidetector CT imaging of the abdomen and pelvis was performed using the standard protocol following bolus administration of intravenous contrast. RADIATION DOSE REDUCTION: This exam was performed according to the departmental dose-optimization program which includes automated exposure control, adjustment of the mA and/or kV according to patient size and/or use of iterative reconstruction technique. CONTRAST:  75mL OMNIPAQUE IOHEXOL 350 MG/ML SOLN COMPARISON:  None Available. FINDINGS: Lower chest: No acute abnormality. Hepatobiliary: No focal liver abnormality is seen. No gallstones, gallbladder wall  thickening, or biliary dilatation. Pancreas: Unremarkable. No pancreatic ductal dilatation or surrounding inflammatory changes. Spleen: Normal. Adrenals/Urinary Tract: Adrenal glands are unremarkable. Kidneys are normal, without renal calculi, focal lesion, or hydronephrosis. Bladder is unremarkable. Stomach/Bowel: Normal stomach and duodenum. No dilated loops of small bowel. Normal appendix is visualized on axial image 64 series 3. No bowel wall thickening or surrounding inflammation. Vascular/Lymphatic: No significant vascular findings are present. No enlarged abdominal or pelvic lymph nodes. Reproductive: Uterus and bilateral adnexa are unremarkable. Other: No abdominal wall hernia or abnormality. No abdominopelvic ascites. Musculoskeletal: No acute or significant osseous findings. IMPRESSION: 1. No acute abnormality of the abdomen or pelvis. 2. Normal appendix. Electronically Signed   By: Orvan Falconer M.D.   On: 12/30/2022 19:59   DG Chest 2 View  Result Date: 12/30/2022 CLINICAL DATA:  Epigastric pain. EXAM: CHEST - 2 VIEW COMPARISON:  01/26/2021. FINDINGS: Clear lungs. Normal heart size and mediastinal contours. No pleural effusion or pneumothorax. Visualized bones and upper abdomen are unremarkable. IMPRESSION: No evidence of acute cardiopulmonary disease. Electronically Signed   By: Orvan Falconer M.D.   On: 12/30/2022 17:15  Final Assessment and Plan:   32 year old female presents to the ED complaining of epigastric pain.  She has been using baking soda at home as a treatment for heartburn but is no longer getting relief.  No other associated symptoms.  Patient has an essentially benign abdominal exam.  No CVA tenderness.  Workup initiated as above for further assessment.  UA with large leukocytes but appears contaminated and patient without urinary symptoms so will not treat.  Otherwise, fairly unremarkable lab work.  Troponin negative x 2.  EKG without acute ST-T changes.  No true chest  pain.  Chest x-ray negative.  With somewhat persistent pain, discussed option to order CT of the abdomen with patient and husband who are agreeable.  Patient declines medications offered in the ED.  She appears well-hydrated and I do not believe requires IV fluids at this time.  CT scan of the abdomen normal.  Patient appears to be in good spirits and in no acute distress.  Vital signs stable.  With this, do suspect that symptoms are likely related to GERD.  Will give prescription for Protonix to see if this helps and have patient closely follow-up with primary care.  Patient agreeable with plan.  Strict ED return precautions given, all questions answered, and stable for discharge.   Clinical Impression:  1. Gastroesophageal reflux disease without esophagitis      Discharge           Final Clinical Impression(s) / ED Diagnoses Final diagnoses:  Gastroesophageal reflux disease without esophagitis    Rx / DC Orders ED Discharge Orders          Ordered    pantoprazole (PROTONIX) 20 MG tablet  Daily        12/30/22 2101              Tonette Lederer, PA-C 12/30/22 2108    Derwood Kaplan, MD 12/31/22 1510

## 2022-12-30 NOTE — ED Provider Triage Note (Signed)
Emergency Medicine Provider Triage Evaluation Note  Annelise Blizard , a 32 y.o. female  was evaluated in triage.  Pt complains of burning epigastric pain up into the chest for the last 2 months.  She states that she was taking baking soda for it with relief but recently it has become worse and more persistent.  No previous abdominal surgeries.  No fever, chills, chest pain, shortness of breath, urinary symptoms, or other complaints.  Patient currently not on any medications.  Review of Systems  Positive: See HPI Negative: See HPI  Physical Exam  BP (!) 122/90   Pulse (!) 59   Temp 98.5 F (36.9 C) (Oral)   Resp 18   SpO2 100%   Breastfeeding No  Gen:   Awake, no distress   Resp:  Normal effort lungs clear to auscultation MSK:   Moves extremities without difficulty no lower extremity edema Other:  Regular rate and rhythm, abdomen soft, nontender, nondistended, and without rebound, guarding, or peritoneal signs, no CVA tenderness  Medical Decision Making  Medically screening exam initiated at 4:40 PM.  Appropriate orders placed.  Maryem Valois was informed that the remainder of the evaluation will be completed by another provider, this initial triage assessment does not replace that evaluation, and the importance of remaining in the ED until their evaluation is complete.  Husband at bedside agreeable to translate   Tonette Lederer, PA-C 12/30/22 1641

## 2023-01-26 DIAGNOSIS — Z419 Encounter for procedure for purposes other than remedying health state, unspecified: Secondary | ICD-10-CM | POA: Diagnosis not present

## 2023-02-25 DIAGNOSIS — Z419 Encounter for procedure for purposes other than remedying health state, unspecified: Secondary | ICD-10-CM | POA: Diagnosis not present

## 2023-03-28 DIAGNOSIS — Z419 Encounter for procedure for purposes other than remedying health state, unspecified: Secondary | ICD-10-CM | POA: Diagnosis not present

## 2023-04-10 ENCOUNTER — Other Ambulatory Visit: Payer: Self-pay

## 2023-04-10 ENCOUNTER — Encounter (HOSPITAL_COMMUNITY): Payer: Self-pay

## 2023-04-10 ENCOUNTER — Emergency Department (HOSPITAL_COMMUNITY)
Admission: EM | Admit: 2023-04-10 | Discharge: 2023-04-10 | Disposition: A | Discharge status: Home / Self Care | Payer: Medicaid Other | Attending: Emergency Medicine | Admitting: Emergency Medicine

## 2023-04-10 DIAGNOSIS — S6991XA Unspecified injury of right wrist, hand and finger(s), initial encounter: Secondary | ICD-10-CM | POA: Diagnosis present

## 2023-04-10 DIAGNOSIS — T310 Burns involving less than 10% of body surface: Secondary | ICD-10-CM | POA: Diagnosis not present

## 2023-04-10 DIAGNOSIS — Y93G3 Activity, cooking and baking: Secondary | ICD-10-CM | POA: Insufficient documentation

## 2023-04-10 DIAGNOSIS — T23201A Burn of second degree of right hand, unspecified site, initial encounter: Secondary | ICD-10-CM | POA: Diagnosis not present

## 2023-04-10 DIAGNOSIS — T23101A Burn of first degree of right hand, unspecified site, initial encounter: Secondary | ICD-10-CM | POA: Diagnosis not present

## 2023-04-10 DIAGNOSIS — X102XXA Contact with fats and cooking oils, initial encounter: Secondary | ICD-10-CM | POA: Diagnosis not present

## 2023-04-10 MED ORDER — NAPROXEN 250 MG PO TABS
375.0000 mg | ORAL_TABLET | Freq: Once | ORAL | Status: AC
  Administered 2023-04-10: 375 mg via ORAL
  Filled 2023-04-10: qty 2

## 2023-04-10 MED ORDER — NAPROXEN 375 MG PO TABS: 375.0000 mg | ORAL_TABLET | Freq: Two times a day (BID) | ORAL | 0 refills | Status: AC
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
## 2023-04-10 NOTE — Discharge Instructions (Signed)
Apply antibiotic ointment to wound if you noticed any blister areas. Take the medications as needed for pain.

## 2023-04-28 DIAGNOSIS — Z419 Encounter for procedure for purposes other than remedying health state, unspecified: Secondary | ICD-10-CM | POA: Diagnosis not present

## 2023-05-28 DIAGNOSIS — Z419 Encounter for procedure for purposes other than remedying health state, unspecified: Secondary | ICD-10-CM | POA: Diagnosis not present

## 2023-06-28 DIAGNOSIS — Z419 Encounter for procedure for purposes other than remedying health state, unspecified: Secondary | ICD-10-CM | POA: Diagnosis not present

## 2023-07-28 DIAGNOSIS — Z419 Encounter for procedure for purposes other than remedying health state, unspecified: Secondary | ICD-10-CM | POA: Diagnosis not present

## 2023-08-28 DIAGNOSIS — Z419 Encounter for procedure for purposes other than remedying health state, unspecified: Secondary | ICD-10-CM | POA: Diagnosis not present
